# Patient Record
Sex: Female | Born: 1960 | Race: White | Hispanic: No | Smoking: Current every day smoker
Health system: Southern US, Community
[De-identification: ages and names within clinical notes are randomized; demographics above are authoritative.]

## PROBLEM LIST (undated history)

## (undated) DIAGNOSIS — K8689 Other specified diseases of pancreas: Secondary | ICD-10-CM

## (undated) DIAGNOSIS — R32 Unspecified urinary incontinence: Secondary | ICD-10-CM

## (undated) DIAGNOSIS — Z860100 Personal history of colon polyps, unspecified: Secondary | ICD-10-CM

## (undated) DIAGNOSIS — R51 Headache: Secondary | ICD-10-CM

## (undated) DIAGNOSIS — G629 Polyneuropathy, unspecified: Secondary | ICD-10-CM

## (undated) DIAGNOSIS — E871 Hypo-osmolality and hyponatremia: Secondary | ICD-10-CM

## (undated) DIAGNOSIS — K648 Other hemorrhoids: Secondary | ICD-10-CM

## (undated) DIAGNOSIS — D1803 Hemangioma of intra-abdominal structures: Secondary | ICD-10-CM

## (undated) DIAGNOSIS — G2581 Restless legs syndrome: Secondary | ICD-10-CM

## (undated) DIAGNOSIS — F410 Panic disorder [episodic paroxysmal anxiety] without agoraphobia: Secondary | ICD-10-CM

## (undated) DIAGNOSIS — Z8719 Personal history of other diseases of the digestive system: Secondary | ICD-10-CM

## (undated) DIAGNOSIS — M2540 Effusion, unspecified joint: Secondary | ICD-10-CM

## (undated) DIAGNOSIS — E559 Vitamin D deficiency, unspecified: Secondary | ICD-10-CM

## (undated) DIAGNOSIS — I5042 Chronic combined systolic (congestive) and diastolic (congestive) heart failure: Secondary | ICD-10-CM

## (undated) DIAGNOSIS — E8801 Alpha-1-antitrypsin deficiency: Secondary | ICD-10-CM

## (undated) DIAGNOSIS — E119 Type 2 diabetes mellitus without complications: Secondary | ICD-10-CM

## (undated) DIAGNOSIS — G510 Bell's palsy: Secondary | ICD-10-CM

## (undated) DIAGNOSIS — Z8614 Personal history of Methicillin resistant Staphylococcus aureus infection: Secondary | ICD-10-CM

## (undated) DIAGNOSIS — M549 Dorsalgia, unspecified: Secondary | ICD-10-CM

## (undated) DIAGNOSIS — G4733 Obstructive sleep apnea (adult) (pediatric): Secondary | ICD-10-CM

## (undated) DIAGNOSIS — Z8711 Personal history of peptic ulcer disease: Secondary | ICD-10-CM

## (undated) DIAGNOSIS — E1140 Type 2 diabetes mellitus with diabetic neuropathy, unspecified: Secondary | ICD-10-CM

## (undated) DIAGNOSIS — R160 Hepatomegaly, not elsewhere classified: Secondary | ICD-10-CM

## (undated) DIAGNOSIS — M5137 Other intervertebral disc degeneration, lumbosacral region: Secondary | ICD-10-CM

## (undated) DIAGNOSIS — M47816 Spondylosis without myelopathy or radiculopathy, lumbar region: Secondary | ICD-10-CM

## (undated) DIAGNOSIS — Z8619 Personal history of other infectious and parasitic diseases: Secondary | ICD-10-CM

## (undated) DIAGNOSIS — G8929 Other chronic pain: Secondary | ICD-10-CM

## (undated) DIAGNOSIS — I10 Essential (primary) hypertension: Secondary | ICD-10-CM

## (undated) DIAGNOSIS — J45909 Unspecified asthma, uncomplicated: Secondary | ICD-10-CM

## (undated) DIAGNOSIS — M810 Age-related osteoporosis without current pathological fracture: Secondary | ICD-10-CM

## (undated) DIAGNOSIS — D649 Anemia, unspecified: Secondary | ICD-10-CM

## (undated) DIAGNOSIS — F411 Generalized anxiety disorder: Secondary | ICD-10-CM

## (undated) DIAGNOSIS — J449 Chronic obstructive pulmonary disease, unspecified: Secondary | ICD-10-CM

## (undated) DIAGNOSIS — M51379 Other intervertebral disc degeneration, lumbosacral region without mention of lumbar back pain or lower extremity pain: Secondary | ICD-10-CM

## (undated) DIAGNOSIS — F419 Anxiety disorder, unspecified: Secondary | ICD-10-CM

## (undated) DIAGNOSIS — M1990 Unspecified osteoarthritis, unspecified site: Secondary | ICD-10-CM

## (undated) DIAGNOSIS — E785 Hyperlipidemia, unspecified: Secondary | ICD-10-CM

## (undated) DIAGNOSIS — K219 Gastro-esophageal reflux disease without esophagitis: Secondary | ICD-10-CM

## (undated) DIAGNOSIS — Z889 Allergy status to unspecified drugs, medicaments and biological substances status: Secondary | ICD-10-CM

## (undated) DIAGNOSIS — I340 Nonrheumatic mitral (valve) insufficiency: Principal | ICD-10-CM

## (undated) DIAGNOSIS — I428 Other cardiomyopathies: Secondary | ICD-10-CM

## (undated) DIAGNOSIS — I517 Cardiomegaly: Secondary | ICD-10-CM

## (undated) DIAGNOSIS — Z720 Tobacco use: Secondary | ICD-10-CM

## (undated) DIAGNOSIS — M797 Fibromyalgia: Secondary | ICD-10-CM

## (undated) HISTORY — DX: Cardiomegaly: I51.7

## (undated) HISTORY — DX: Restless legs syndrome: G25.81

## (undated) HISTORY — PX: BREAST BIOPSY: SHX20

## (undated) HISTORY — PX: APPENDECTOMY: SHX54

## (undated) HISTORY — DX: Other intervertebral disc degeneration, lumbosacral region without mention of lumbar back pain or lower extremity pain: M51.379

## (undated) HISTORY — PX: CARDIAC CATHETERIZATION: SHX172

## (undated) HISTORY — DX: Anxiety disorder, unspecified: F41.9

## (undated) HISTORY — DX: Hyperlipidemia, unspecified: E78.5

## (undated) HISTORY — DX: Alpha-1-antitrypsin deficiency: E88.01

## (undated) HISTORY — DX: Obstructive sleep apnea (adult) (pediatric): G47.33

## (undated) HISTORY — DX: Generalized anxiety disorder: F41.1

## (undated) HISTORY — DX: Unspecified urinary incontinence: R32

## (undated) HISTORY — PX: OTHER SURGICAL HISTORY: SHX169

## (undated) HISTORY — DX: Essential (primary) hypertension: I10

## (undated) HISTORY — DX: Type 2 diabetes mellitus without complications: E11.9

## (undated) HISTORY — DX: Hepatomegaly, not elsewhere classified: R16.0

## (undated) HISTORY — DX: Nonrheumatic mitral (valve) insufficiency: I34.0

## (undated) HISTORY — DX: Other hemorrhoids: K64.8

## (undated) HISTORY — PX: CHOLECYSTECTOMY: SHX55

## (undated) HISTORY — DX: Morbid (severe) obesity due to excess calories: E66.01

## (undated) HISTORY — DX: Chronic obstructive pulmonary disease, unspecified: J44.9

## (undated) HISTORY — DX: Type 2 diabetes mellitus with diabetic neuropathy, unspecified: E11.40

## (undated) HISTORY — DX: Panic disorder (episodic paroxysmal anxiety): F41.0

## (undated) HISTORY — DX: Unspecified osteoarthritis, unspecified site: M19.90

## (undated) HISTORY — DX: Unspecified asthma, uncomplicated: J45.909

## (undated) HISTORY — DX: Personal history of other diseases of the digestive system: Z87.19

## (undated) HISTORY — PX: NASAL SINUS SURGERY: SHX719

## (undated) HISTORY — DX: Other cardiomyopathies: I42.8

## (undated) HISTORY — DX: Other specified diseases of pancreas: K86.89

## (undated) HISTORY — DX: Spondylosis without myelopathy or radiculopathy, lumbar region: M47.816

## (undated) HISTORY — DX: Vitamin D deficiency, unspecified: E55.9

## (undated) HISTORY — DX: Hypo-osmolality and hyponatremia: E87.1

## (undated) HISTORY — DX: Gastro-esophageal reflux disease without esophagitis: K21.9

## (undated) HISTORY — DX: Age-related osteoporosis without current pathological fracture: M81.0

## (undated) HISTORY — PX: ESOPHAGOGASTRODUODENOSCOPY: SHX1529

## (undated) HISTORY — DX: Tobacco use: Z72.0

## (undated) HISTORY — DX: Chronic combined systolic (congestive) and diastolic (congestive) heart failure: I50.42

## (undated) HISTORY — DX: Hemangioma of intra-abdominal structures: D18.03

## (undated) HISTORY — PX: COLONOSCOPY: SHX174

## (undated) HISTORY — DX: Other chronic pain: G89.29

## (undated) HISTORY — DX: Other intervertebral disc degeneration, lumbosacral region: M51.37

## (undated) HISTORY — DX: Anemia, unspecified: D64.9

## (undated) HISTORY — DX: Disorders of magnesium metabolism, unspecified: E83.40

---

## 2003-02-14 DIAGNOSIS — Z8614 Personal history of Methicillin resistant Staphylococcus aureus infection: Secondary | ICD-10-CM

## 2003-02-14 HISTORY — DX: Personal history of Methicillin resistant Staphylococcus aureus infection: Z86.14

## 2013-03-20 ENCOUNTER — Ambulatory Visit (INDEPENDENT_AMBULATORY_CARE_PROVIDER_SITE_OTHER): Payer: Medicaid Other | Admitting: Internal Medicine

## 2013-03-20 ENCOUNTER — Encounter (INDEPENDENT_AMBULATORY_CARE_PROVIDER_SITE_OTHER): Payer: Self-pay

## 2013-03-20 ENCOUNTER — Encounter: Payer: Self-pay | Admitting: Internal Medicine

## 2013-03-20 VITALS — BP 142/74 | HR 87 | Ht 63.0 in | Wt 216.0 lb

## 2013-03-20 DIAGNOSIS — I428 Other cardiomyopathies: Secondary | ICD-10-CM

## 2013-03-20 DIAGNOSIS — I429 Cardiomyopathy, unspecified: Secondary | ICD-10-CM

## 2013-03-20 DIAGNOSIS — I10 Essential (primary) hypertension: Secondary | ICD-10-CM

## 2013-03-20 DIAGNOSIS — G4733 Obstructive sleep apnea (adult) (pediatric): Secondary | ICD-10-CM

## 2013-03-20 DIAGNOSIS — I340 Nonrheumatic mitral (valve) insufficiency: Secondary | ICD-10-CM

## 2013-03-20 DIAGNOSIS — I509 Heart failure, unspecified: Secondary | ICD-10-CM

## 2013-03-20 DIAGNOSIS — I059 Rheumatic mitral valve disease, unspecified: Secondary | ICD-10-CM

## 2013-03-20 DIAGNOSIS — I5042 Chronic combined systolic (congestive) and diastolic (congestive) heart failure: Secondary | ICD-10-CM

## 2013-03-20 NOTE — Progress Notes (Signed)
Primary Care Physician: Glendon Axe, MD Referring Physician:  Dr Lawson Radar  Audrey Olson is a 53 y.o. female with a h/o a nonischemic cardiomyopathy, NYHA Class III CHF, and mitral regurgitation who presents today for EP consultation.  She reports having a nonischemic cardiomyopathy diagnosed 4 months ago.  She reports that her primary concern is being frequently tired.  She falls asleep all of the time.  She has had a sleep study which she says reveals sleep apnea.   She was recommended to use CPAP but could not tolerate this.  She has fatigue.  She denies SOB.  She is limited by leg weakness which occurs after walking 100 feet.  She has 3 pillow orthopnea.  She continues to smoke and is not ready to quit.  Today, she denies symptoms of palpitations, chest pain, lower extremity edema, dizziness, presyncope, syncope, or neurologic sequela. The patient is tolerating medications without difficulties and is otherwise without complaint today.   Past Medical History  Diagnosis Date  . Lumbar spondylosis   . Hypertrophy of breast   . CKD (chronic kidney disease) stage 3, GFR 30-59 ml/min   . Hypertension   . Hyperlipidemia   . Acid reflux   . Obesity   . Restless legs syndrome (RLS)   . Tobacco use disorder   . Vitamin D deficiency   . Diabetes mellitus type 2, uncontrolled   . History of noncompliance with medical treatment, presenting hazards to health   . History of rectal bleeding   . Proteinuria   . Hepatomegaly     With splenomegaly  . Unspecified urinary incontinence   . Fatigue   . Unspecified sleep apnea     mild per pt  . Degeneration of lumbar or lumbosacral intervertebral disc   . Fatty pancreas   . Fatty liver   . Internal hemorrhoids   . LVH (left ventricular hypertrophy)   . CHF (congestive heart failure)   . Cardiomyopathy   . Benign essential tremor   . Generalized anxiety disorder   . Anemia   . Hyponatremia   . Disorders of magnesium metabolism   .  Alpha-1-antitrypsin deficiency     listed, she is not aware of this diagnosis  . Colon polyp   . Hemangioma of spleen    Past Surgical History  Procedure Laterality Date  . Breast biopsy    . Nasal sinus surgery    . Appendectomy    . Cholecystectomy    . Incisional hernia repair      Current Outpatient Prescriptions  Medication Sig Dispense Refill  . albuterol (PROVENTIL HFA;VENTOLIN HFA) 108 (90 BASE) MCG/ACT inhaler Inhale 2 puffs into the lungs every 6 (six) hours as needed for wheezing or shortness of breath.      . carvedilol (COREG) 25 MG tablet Take 25 mg by mouth 2 (two) times daily with a meal.      . clonazePAM (KLONOPIN) 1 MG tablet Take 1 mg by mouth at bedtime as needed for anxiety.      . cyclobenzaprine (FLEXERIL) 10 MG tablet Take 10 mg by mouth 3 (three) times daily as needed for muscle spasms.      Marland Kitchen HYDROcodone-acetaminophen (NORCO) 10-325 MG per tablet Take 1 tablet by mouth every 6 (six) hours as needed.      . insulin aspart (NOVOLOG FLEXPEN) 100 UNIT/ML FlexPen Sliding scale      . insulin glargine (LANTUS) 100 UNIT/ML injection Inject 80 Units into the skin 3 (three) times  daily.      . Magnesium Oxide 250 MG TABS Take 1 tablet by mouth daily.      . metFORMIN (GLUCOPHAGE) 1000 MG tablet Take 1,000 mg by mouth 2 (two) times daily with a meal.      . nitroGLYCERIN (NITROSTAT) 0.4 MG SL tablet Place 0.4 mg under the tongue every 5 (five) minutes as needed for chest pain.      Marland Kitchen omega-3 acid ethyl esters (LOVAZA) 1 G capsule Take 1 g by mouth daily.      Marland Kitchen omeprazole (PRILOSEC) 40 MG capsule Take 40 mg by mouth daily.      . pregabalin (LYRICA) 150 MG capsule Take 150 mg by mouth 2 (two) times daily.      . rosuvastatin (CRESTOR) 40 MG tablet Take 40 mg by mouth daily.      . valsartan (DIOVAN) 160 MG tablet Take 160 mg by mouth daily.      . Vitamin D, Ergocalciferol, (DRISDOL) 50000 UNITS CAPS capsule Take 50,000 Units by mouth every 7 (seven) days.       No  current facility-administered medications for this visit.    Allergies  Allergen Reactions  . Sulfa Antibiotics Anaphylaxis    History   Social History  . Marital Status: Divorced    Spouse Name: N/A    Number of Children: N/A  . Years of Education: N/A   Occupational History  . Not on file.   Social History Main Topics  . Smoking status: Current Every Day Smoker -- 0.33 packs/day    Types: Cigarettes    Start date: 02/13/1974  . Smokeless tobacco: Not on file  . Alcohol Use: No  . Drug Use: No  . Sexual Activity: Not on file   Other Topics Concern  . Not on file   Social History Narrative   Lives in [place] with sister.  Unemployed    Family History  Problem Relation Age of Onset  . Hyperlipidemia Mother   . Hypertension Mother   . Heart disease Mother   . Hyperlipidemia Father   . Diabetes Father     DM 2   . Hypertension Father   . Heart disease Father     ROS- All systems are reviewed and negative except as per the HPI above  Physical Exam: Filed Vitals:   03/20/13 0854  BP: 142/74  Pulse: 87  Height: 5\' 3"  (1.6 m)  Weight: 216 lb (97.977 kg)    GEN- The patient is overweight appearing, alert and oriented x 3 today.   Head- normocephalic, atraumatic Eyes-  Sclera clear, conjunctiva pink Ears- hearing intact Oropharynx- clear Neck- supple, no JVP Lymph- no cervical lymphadenopathy Lungs- Clear to ausculation bilaterally, normal work of breathing Heart- Regular rate and rhythm, 2/6 SEM at the apex GI- soft, NT, ND, + BS Extremities- no clubbing, cyanosis, or edema MS- no significant deformity or atrophy Skin- no rash or lesion Psych- euthymic mood, full affect Neuro- strength and sensation are intact  2dEcho 01/30/13- EF <35%, LVEDD 54, LA 50, views were suboptimal and valves were not commented upon TEE 02/27/13- EF 30-33% with moderate LV dilatation, moderate to severe MR, RV systolic pressure 0000000 mm Hg  myoview 08/28/12- EF 19%,  no ischemia  ekg today reveals sinus rhythm 87 bpm, PR 166, QRS 96, QTc 476, septal infarct, otherwise normal ekg  Assessment and Plan:   1. Nonischemic CM (EF 30%), NYHA Class III CHF, and moderate to severe MR The patient  is referred for EP consultation and consideration of an ICD.  She has a narrow QRS and does not meet criteria for CRT.  She is on beta blocker and an ARB.  Her medicines appear to be optimized.  Myoview in 2014 revealed no reversible areas of ischemia.   Though she probably meets criteria for an ICD, I think that we should first further evaluate her MR.  This is moderate to severe by TEE.  I have spoken with Dr Claudie Leach who agrees with referral for consultation with Dr Roxy Manns for possible mitral valve repair.  She does have symptoms and could potentially benefit from this.  I have also spoken with Dr Roxy Manns who is willing to see the patient.  If she is felt to not be a candidate for valve repair then we could proceed with an ICD.  If she undergoes valve surgery then we would defer decisions regarding an ICD until we see how her EF will respond to surgery.  2. HTN Stable No change required today  3. OSA Compliance with weight loss is encouraged  She will return to see me for follow-up in 2 months.

## 2013-03-20 NOTE — Patient Instructions (Addendum)
Your physician recommends that you schedule a follow-up appointment in: 04/21/13 at 4:15  You have been referred to Dr Owen-----For MR  Your physician has recommended that you have a defibrillator inserted. An implantable cardioverter defibrillator (ICD) is a small device that is placed in your chest or, in rare cases, your abdomen. This device uses electrical pulses or shocks to help control life-threatening, irregular heartbeats that could lead the heart to suddenly stop beating (sudden cardiac arrest). Leads are attached to the ICD that goes into your heart. This is done in the hospital and usually requires an overnight stay. Please see the instruction sheet given to you today for more information.

## 2013-03-21 ENCOUNTER — Ambulatory Visit: Payer: Medicaid Other | Admitting: Thoracic Surgery (Cardiothoracic Vascular Surgery)

## 2013-03-28 ENCOUNTER — Institutional Professional Consult (permissible substitution) (INDEPENDENT_AMBULATORY_CARE_PROVIDER_SITE_OTHER): Payer: Medicaid Other | Admitting: Thoracic Surgery (Cardiothoracic Vascular Surgery)

## 2013-03-28 ENCOUNTER — Encounter: Payer: Self-pay | Admitting: Thoracic Surgery (Cardiothoracic Vascular Surgery)

## 2013-03-28 VITALS — BP 154/84 | HR 99 | Resp 20 | Ht 62.0 in | Wt 217.0 lb

## 2013-03-28 DIAGNOSIS — E1140 Type 2 diabetes mellitus with diabetic neuropathy, unspecified: Secondary | ICD-10-CM

## 2013-03-28 DIAGNOSIS — M810 Age-related osteoporosis without current pathological fracture: Secondary | ICD-10-CM

## 2013-03-28 DIAGNOSIS — I10 Essential (primary) hypertension: Secondary | ICD-10-CM

## 2013-03-28 DIAGNOSIS — I340 Nonrheumatic mitral (valve) insufficiency: Secondary | ICD-10-CM

## 2013-03-28 DIAGNOSIS — F411 Generalized anxiety disorder: Secondary | ICD-10-CM

## 2013-03-28 DIAGNOSIS — N189 Chronic kidney disease, unspecified: Secondary | ICD-10-CM

## 2013-03-28 DIAGNOSIS — Z992 Dependence on renal dialysis: Secondary | ICD-10-CM | POA: Insufficient documentation

## 2013-03-28 DIAGNOSIS — E1129 Type 2 diabetes mellitus with other diabetic kidney complication: Secondary | ICD-10-CM

## 2013-03-28 DIAGNOSIS — F17200 Nicotine dependence, unspecified, uncomplicated: Secondary | ICD-10-CM

## 2013-03-28 DIAGNOSIS — G8929 Other chronic pain: Secondary | ICD-10-CM

## 2013-03-28 DIAGNOSIS — M1990 Unspecified osteoarthritis, unspecified site: Secondary | ICD-10-CM

## 2013-03-28 DIAGNOSIS — M129 Arthropathy, unspecified: Secondary | ICD-10-CM

## 2013-03-28 DIAGNOSIS — E119 Type 2 diabetes mellitus without complications: Secondary | ICD-10-CM

## 2013-03-28 DIAGNOSIS — I428 Other cardiomyopathies: Secondary | ICD-10-CM

## 2013-03-28 DIAGNOSIS — E1121 Type 2 diabetes mellitus with diabetic nephropathy: Secondary | ICD-10-CM

## 2013-03-28 DIAGNOSIS — E1142 Type 2 diabetes mellitus with diabetic polyneuropathy: Secondary | ICD-10-CM

## 2013-03-28 DIAGNOSIS — F419 Anxiety disorder, unspecified: Secondary | ICD-10-CM | POA: Insufficient documentation

## 2013-03-28 DIAGNOSIS — E669 Obesity, unspecified: Secondary | ICD-10-CM

## 2013-03-28 DIAGNOSIS — E559 Vitamin D deficiency, unspecified: Secondary | ICD-10-CM | POA: Insufficient documentation

## 2013-03-28 DIAGNOSIS — I5042 Chronic combined systolic (congestive) and diastolic (congestive) heart failure: Secondary | ICD-10-CM | POA: Insufficient documentation

## 2013-03-28 DIAGNOSIS — E7800 Pure hypercholesterolemia, unspecified: Secondary | ICD-10-CM

## 2013-03-28 DIAGNOSIS — I059 Rheumatic mitral valve disease, unspecified: Secondary | ICD-10-CM

## 2013-03-28 DIAGNOSIS — J449 Chronic obstructive pulmonary disease, unspecified: Secondary | ICD-10-CM

## 2013-03-28 DIAGNOSIS — G4733 Obstructive sleep apnea (adult) (pediatric): Secondary | ICD-10-CM | POA: Insufficient documentation

## 2013-03-28 DIAGNOSIS — I509 Heart failure, unspecified: Secondary | ICD-10-CM

## 2013-03-28 DIAGNOSIS — N186 End stage renal disease: Secondary | ICD-10-CM | POA: Insufficient documentation

## 2013-03-28 DIAGNOSIS — N058 Unspecified nephritic syndrome with other morphologic changes: Secondary | ICD-10-CM

## 2013-03-28 DIAGNOSIS — F410 Panic disorder [episodic paroxysmal anxiety] without agoraphobia: Secondary | ICD-10-CM

## 2013-03-28 DIAGNOSIS — Z720 Tobacco use: Secondary | ICD-10-CM

## 2013-03-28 DIAGNOSIS — E1149 Type 2 diabetes mellitus with other diabetic neurological complication: Secondary | ICD-10-CM

## 2013-03-28 NOTE — H&P (Signed)
[address]       [place] [address]             [facility] REPORT  Referring Provider is Franchot Mimes, MD PCP is Glendon Axe, MD  Chief Complaint  Patient presents with  . Mitral Regurgitation    Surgical eval, ECHO      HPI:  Patient is an obese 53 year old female with multiple medical problems referred for possible surgical treatment of mitral regurgitation.  The patient has history of hypertension, hyperlipidemia, and long-standing history of poorly controlled type 2 diabetes mellitus with complications including diabetic nephropathy and peripheral neuropathy. She has long-standing history of heavy tobacco abuse with COPD.  She does not recur when she is ever been told she had a heart murmur in the past, and she denies any known history of rheumatic heart disease. She was recently evaluated by Dr. Claudie Leach in [facility] and found to have global nonischemic cardiomyopathy with mitral regurgitation. Left heart catheterization performed in October 2014 at [facility] was notable for the presence of normal coronary artery anatomy with no significant coronary artery disease.  Right heart catheterization was not done.  She was referred to Dr. Rayann Heman to consider placement of a defibrillator, but because of the presence of significant underlying mitral regurgitation the patient has now been referred for possible surgical intervention.  The patient describes chronic symptoms of exertional shortness of breath which she claims to be relatively mild. She admits that she lives a very sedentary lifestyle as she is limited severely by chronic pain in her back, both hips and both legs.  She states that pain is by far her biggest problem and exertional shortness of breath does not limited her activity much on a daily basis. She denies any history of resting shortness of breath, PND, orthopnea, palpitations, or syncope. She has occasional  mild lower extremity edema and occasional dizzy spells. She has vague atypical symptoms of tightness across her chest that are not related to activity and seemed to be transient.  Past Medical History  Diagnosis Date  . Lumbar spondylosis   . Hypertrophy of breast   . CKD (chronic kidney disease) stage 3, GFR 30-59 ml/min   . Hypertension   . Hyperlipidemia   . Acid reflux   . Obesity   . Restless legs syndrome (RLS)   . Tobacco use disorder   . Vitamin D deficiency   . Diabetes mellitus type 2, uncontrolled   . History of noncompliance with medical treatment, presenting hazards to health   . History of rectal bleeding   . Proteinuria   . Hepatomegaly     With splenomegaly  . Unspecified urinary incontinence   . Fatigue   . Unspecified sleep apnea     mild per pt  . Degeneration of lumbar or lumbosacral intervertebral disc   . Fatty pancreas   . Fatty liver   . Internal hemorrhoids   . LVH (left ventricular hypertrophy)   . CHF (congestive heart failure)   . Cardiomyopathy   . Benign essential tremor   . Generalized anxiety disorder   . Anemia   . Hyponatremia   . Disorders of magnesium metabolism   . Alpha-1-antitrypsin deficiency     listed, she is not aware of this diagnosis  . Colon polyp   . Hemangioma of spleen   . Chronic combined systolic and diastolic CHF (congestive heart failure)   .  Non-ischemic cardiomyopathy   . Mitral regurgitation   . Type II diabetes mellitus     Insulin-dependent since 2008  . Diabetic neuropathy   . Chronic pain   . Panic attacks   . Anxiety   . Chronic kidney disease   . Diabetic nephropathy   . Tobacco abuse   . COPD (chronic obstructive pulmonary disease)   . Obstructive sleep apnea     Doesn't tolerate CPAP  . Severe obesity (BMI 35.0-39.9) with comorbidity   . Arthritis     Lower back and both hips with severe chronic pain and limited mobility  . Hypercholesterolemia   . Osteoporosis     Past Surgical History    Procedure Laterality Date  . Breast biopsy    . Nasal sinus surgery    . Appendectomy    . Cholecystectomy    . Incisional hernia repair      Family History  Problem Relation Age of Onset  . Hyperlipidemia Mother   . Hypertension Mother   . Heart disease Mother   . Hyperlipidemia Father   . Diabetes Father     DM 2   . Hypertension Father   . Heart disease Father     History   Social History  . Marital Status: Divorced    Spouse Name: N/A    Number of Children: N/A  . Years of Education: N/A   Occupational History  . Not on file.   Social History Main Topics  . Smoking status: Current Every Day Smoker -- 0.33 packs/day    Types: Cigarettes    Start date: 02/13/1974  . Smokeless tobacco: Not on file  . Alcohol Use: No  . Drug Use: No  . Sexual Activity: Not on file   Other Topics Concern  . Not on file   Social History Narrative   Lives in [place] with sister.  Unemployed    Current Outpatient Prescriptions  Medication Sig Dispense Refill  . albuterol (PROVENTIL HFA;VENTOLIN HFA) 108 (90 BASE) MCG/ACT inhaler Inhale 2 puffs into the lungs every 6 (six) hours as needed for wheezing or shortness of breath.      . carvedilol (COREG) 25 MG tablet Take 25 mg by mouth 2 (two) times daily with a meal.      . clonazePAM (KLONOPIN) 1 MG tablet Take 1 mg by mouth at bedtime as needed for anxiety.      . cyclobenzaprine (FLEXERIL) 10 MG tablet Take 10 mg by mouth 3 (three) times daily as needed for muscle spasms.      Marland Kitchen HYDROcodone-acetaminophen (NORCO) 10-325 MG per tablet Take 1 tablet by mouth every 6 (six) hours as needed.      . insulin aspart (NOVOLOG FLEXPEN) 100 UNIT/ML FlexPen Sliding scale      . insulin glargine (LANTUS) 100 UNIT/ML injection Inject 80 Units into the skin 3 (three) times daily.      . Magnesium Oxide 250 MG TABS Take 1 tablet by mouth daily.      . metFORMIN (GLUCOPHAGE) 1000 MG tablet Take 1,000 mg by mouth 2 (two) times daily with a  meal.      . nitroGLYCERIN (NITROSTAT) 0.4 MG SL tablet Place 0.4 mg under the tongue every 5 (five) minutes as needed for chest pain.      Marland Kitchen omega-3 acid ethyl esters (LOVAZA) 1 G capsule Take 1 g by mouth daily.      Marland Kitchen omeprazole (PRILOSEC) 40 MG capsule Take 40 mg by mouth  daily.      . pregabalin (LYRICA) 150 MG capsule Take 150 mg by mouth 2 (two) times daily.      . rosuvastatin (CRESTOR) 40 MG tablet Take 40 mg by mouth daily.      . valsartan (DIOVAN) 160 MG tablet Take 160 mg by mouth daily.      . Vitamin D, Ergocalciferol, (DRISDOL) 50000 UNITS CAPS capsule Take 50,000 Units by mouth every 7 (seven) days.       No current facility-administered medications for this visit.    Allergies  Allergen Reactions  . Sulfa Antibiotics Anaphylaxis      Review of Systems:   General:  normal appetite, decreased energy, no weight gain, no weight loss, no fever  Cardiac:  no chest pain with exertion, + transient atypical chest pain at rest, + SOB with exertion, no resting SOB, no PND, no orthopnea, no palpitations, no arrhythmia, no atrial fibrillation, + intermittent mild LE edema, occasional dizzy spells, no syncope  Respiratory:  + shortness of breath, no home oxygen, no productive cough, + dry cough, no bronchitis, no wheezing, no hemoptysis, no asthma, no pain with inspiration or cough, + sleep apnea, doesn't tolerate CPAP at night  GI:   no difficulty swallowing, + reflux, no frequent heartburn, no hiatal hernia, + abdominal pain, no constipation, no diarrhea, no hematochezia, no hematemesis, no melena  GU:   no dysuria,  + frequency, no urinary tract infection, no hematuria, no kidney stones, + chronic kidney disease with proteinuria  Vascular:  + pain suggestive of claudication, + pain in feet, no leg cramps, no varicose veins, no DVT, no non-healing foot ulcer  Neuro:   no stroke, ? TIA's, no seizures, no headaches, no temporary blindness one eye,  no slurred speech, + peripheral  neuropathy, + severe chronic pain, no instability of gait, no memory/cognitive dysfunction  Musculoskeletal: + arthritis, no joint swelling, + myalgias, + difficulty walking, limited mobility   Skin:   no rash, no itching, no skin infections, no pressure sores or ulcerations  Psych:   + anxiety, no depression, + nervousness, no unusual recent stress  Eyes:   no blurry vision, no floaters, no recent vision changes, + wears glasses or contacts  ENT:   no hearing loss, no loose or painful teeth, no dentures, last saw dentist > 10 years ago  Hematologic:  no easy bruising, no abnormal bleeding, no clotting disorder, no frequent epistaxis  Endocrine:  + diabetes, routinely checks CBG's at home     Physical Exam:   BP 154/84  Pulse 99  Resp 20  Ht 5\' 2"  (1.575 m)  Wt 217 lb (98.431 kg)  BMI 39.68 kg/m2  SpO2 95%  General:  Obese, chronically ill-appearing  HEENT:  Unremarkable   Neck:   no JVD, no bruits, no adenopathy   Chest:   clear to auscultation, symmetrical breath sounds, no wheezes, no rhonchi   CV:   RRR, no murmur   Abdomen:  soft, non-tender, no masses   Extremities:  warm, well-perfused, pulses not palpable, no LE edema  Rectal/GU  Deferred  Neuro:   Grossly non-focal and symmetrical throughout  Skin:   Clean and dry, no rashes, no breakdown   Diagnostic Tests:  TRANSESOPHAGEAL ECHOCARDIOGRAM  Images from transesophageal echocardiogram performed 02/27/2013 at [facility] are reviewed. The patient has severe global left ventricular systolic dysfunction.  There is type I dysfunction of the mitral valve with at least moderate if not severe mitral regurgitation.  The aortic valve appears normal. Right ventricular size and systolic function appears fairly normal. There is mild tricuspid regurgitation. No other significant abnormalities are noted. Official report from this echocardiogram is not available at the present time.    CARDIAC CATHETERIZATION  Report from  left heart catheterization performed 12/09/2012 at [facility] is reviewed. Images from this catheterization are not available for review at this time. By report the patient had normal coronary artery anatomy with no significant coronary artery disease. Left ventricular ejection fraction was estimated 35%. There was no comment made about the presence or absence of mitral regurgitation. Right heart catheterization was not performed.   Impression:  The patient has nonischemic cardiomyopathy with at least moderate if not severe left ventricular dysfunction and perhaps moderate to severe mitral regurgitation. Pathophysiology of the patient's mitral regurgitation appears to be primarily functional and related to type I dysfunction. She describes mild symptoms of exertional shortness of breath. It is not clear at present whether or not she has had significant congestive heart failure in the past, and unfortunately we do not have records from her cardiologist's office.   In addition, we do not have electrocardiogram to review to assess for QRS duration or other findings to suggest the patient might benefit from resynchronization therapy.  Risks associated with mitral valve repair would be somewhat elevated because of the patient's numerous comorbid medical problems including poorly controlled diabetes with numerous associated manifestations, ongoing and long-standing tobacco abuse with underlying COPD, obesity, and severe chronic pain with anxiety. Furthermore, there is some mention in the patient's records of problems with medical noncompliance in the past.   Plan:  We will attempt to obtain records from Dr. Verdon Cummins office as well as a copy of the heart catheterization performed last October. The patient needs dental service consultation and pulmonary function testing. The need to quit smoking has been emphasized. We will obtain CT angiogram of the aorta to assess possible cannulation strategies for surgery.  We  will consider whether or not to obtain right heart catheterization depending on plans in the future. The patient will return in 3 weeks for further followup.    Valentina Gu. Roxy Manns, MD 03/28/2013 3:11 PM

## 2013-03-28 NOTE — Patient Instructions (Signed)
The patient should make every effort to stop smoking immediately and permanently.   Endocarditis is a potentially serious infection of heart valves or inside lining of the heart.  It occurs more commonly in patients with diseased heart valves (such as patient's with aortic or mitral valve disease) and in patients who have undergone heart valve repair or replacement.  Certain surgical and dental procedures may put you at risk, such as dental cleaning, other dental procedures, or any surgery involving the respiratory, urinary, gastrointestinal tract, gallbladder or prostate gland.   To minimize your chances for develooping endocarditis, maintain good oral health and seek prompt medical attention for any infections involving the mouth, teeth, gums, skin or urinary tract.  Always notify your doctor or dentist about your underlying heart valve condition before having any invasive procedures. You will need to take antibiotics before certain procedures.

## 2013-04-02 ENCOUNTER — Other Ambulatory Visit: Payer: Self-pay | Admitting: *Deleted

## 2013-04-02 DIAGNOSIS — I340 Nonrheumatic mitral (valve) insufficiency: Secondary | ICD-10-CM

## 2013-04-02 DIAGNOSIS — I7100 Dissection of unspecified site of aorta: Secondary | ICD-10-CM

## 2013-04-04 ENCOUNTER — Other Ambulatory Visit (HOSPITAL_COMMUNITY): Payer: Medicaid Other | Admitting: Dentistry

## 2013-04-04 ENCOUNTER — Telehealth (HOSPITAL_COMMUNITY): Payer: Self-pay | Admitting: Dentistry

## 2013-04-04 NOTE — Telephone Encounter (Signed)
04/04/2013  Patient:            Audrey Olson Date of Birth:  [birth year] MRN:                [id number]  Patient was a No show- No call for Dental Consultation this morning. No answer at home. Sister was called and indicated that patient was evaluated last evening and this morning for urinary tract infection at [facility] emergency department. Sister is to have the patient call back to reschedule appointment once medically stable. LIngram/Dr. Enrique Sack

## 2013-04-09 ENCOUNTER — Encounter: Payer: Self-pay | Admitting: *Deleted

## 2013-04-11 ENCOUNTER — Ambulatory Visit (HOSPITAL_COMMUNITY): Payer: Medicaid Other

## 2013-04-11 ENCOUNTER — Other Ambulatory Visit (HOSPITAL_COMMUNITY): Payer: Medicaid Other

## 2013-04-11 ENCOUNTER — Inpatient Hospital Stay (HOSPITAL_COMMUNITY): Admission: RE | Admit: 2013-04-11 | Payer: Medicaid Other | Source: Ambulatory Visit

## 2013-04-11 ENCOUNTER — Ambulatory Visit (HOSPITAL_COMMUNITY): Admission: RE | Admit: 2013-04-11 | Payer: Medicaid Other | Source: Ambulatory Visit

## 2013-04-15 ENCOUNTER — Ambulatory Visit (HOSPITAL_COMMUNITY)
Admission: RE | Admit: 2013-04-15 | Discharge: 2013-04-15 | Disposition: A | Discharge status: Home / Self Care | Payer: Medicaid Other | Source: Ambulatory Visit | Attending: Thoracic Surgery (Cardiothoracic Vascular Surgery) | Admitting: Thoracic Surgery (Cardiothoracic Vascular Surgery)

## 2013-04-15 ENCOUNTER — Ambulatory Visit (HOSPITAL_COMMUNITY): Admission: RE | Admit: 2013-04-15 | Payer: Medicaid Other | Source: Ambulatory Visit

## 2013-04-15 ENCOUNTER — Other Ambulatory Visit: Payer: Self-pay

## 2013-04-15 DIAGNOSIS — I059 Rheumatic mitral valve disease, unspecified: Secondary | ICD-10-CM | POA: Insufficient documentation

## 2013-04-15 DIAGNOSIS — I7100 Dissection of unspecified site of aorta: Secondary | ICD-10-CM | POA: Diagnosis not present

## 2013-04-15 DIAGNOSIS — I340 Nonrheumatic mitral (valve) insufficiency: Secondary | ICD-10-CM

## 2013-04-15 DIAGNOSIS — I517 Cardiomegaly: Secondary | ICD-10-CM

## 2013-04-15 LAB — POCT I-STAT, CHEM 8
BUN: 33 mg/dL — ABNORMAL HIGH (ref 6–23)
CALCIUM ION: 1.2 mmol/L (ref 1.12–1.23)
CREATININE: 1.3 mg/dL — AB (ref 0.50–1.10)
Chloride: 104 mEq/L (ref 96–112)
GLUCOSE: 480 mg/dL — AB (ref 70–99)
HEMATOCRIT: 40 % (ref 36.0–46.0)
HEMOGLOBIN: 13.6 g/dL (ref 12.0–15.0)
POTASSIUM: 4.9 meq/L (ref 3.7–5.3)
Sodium: 133 mEq/L — ABNORMAL LOW (ref 137–147)
TCO2: 26 mmol/L (ref 0–100)

## 2013-04-15 LAB — PULMONARY FUNCTION TEST
DL/VA % pred: 95 %
DL/VA: 4.49 ml/min/mmHg/L
DLCO UNC % PRED: 73 %
DLCO UNC: 16.81 ml/min/mmHg
FEF 25-75 PRE: 0.88 L/s
FEF 25-75 Post: 1.39 L/sec
FEF2575-%Change-Post: 57 %
FEF2575-%Pred-Post: 52 %
FEF2575-%Pred-Pre: 33 %
FEV1-%CHANGE-POST: 11 %
FEV1-%PRED-POST: 65 %
FEV1-%Pred-Pre: 59 %
FEV1-PRE: 1.59 L
FEV1-Post: 1.77 L
FEV1FVC-%Change-Post: 5 %
FEV1FVC-%Pred-Pre: 83 %
FEV6-%Change-Post: 6 %
FEV6-%PRED-POST: 75 %
FEV6-%PRED-PRE: 70 %
FEV6-POST: 2.48 L
FEV6-Pre: 2.32 L
FEV6FVC-%CHANGE-POST: 1 %
FEV6FVC-%PRED-POST: 102 %
FEV6FVC-%PRED-PRE: 101 %
FVC-%Change-Post: 5 %
FVC-%PRED-PRE: 69 %
FVC-%Pred-Post: 73 %
FVC-POST: 2.49 L
FVC-PRE: 2.37 L
POST FEV6/FVC RATIO: 99 %
PRE FEV6/FVC RATIO: 98 %
Post FEV1/FVC ratio: 71 %
Pre FEV1/FVC ratio: 67 %
RV % PRED: 104 %
RV: 1.84 L
TLC % PRED: 83 %
TLC: 4.08 L

## 2013-04-15 MED ORDER — ALBUTEROL SULFATE (2.5 MG/3ML) 0.083% IN NEBU
2.5000 mg | INHALATION_SOLUTION | Freq: Once | RESPIRATORY_TRACT | Status: AC
  Administered 2013-04-15: 2.5 mg via RESPIRATORY_TRACT

## 2013-04-15 MED ORDER — IOHEXOL 350 MG/ML SOLN
100.0000 mL | Freq: Once | INTRAVENOUS | Status: AC | PRN
  Administered 2013-04-15: 100 mL via INTRAVENOUS

## 2013-04-15 NOTE — Progress Notes (Signed)
Echo Lab  2D Echocardiogram completed.  Gwynn, RDCS 04/15/2013 8:51 AM

## 2013-04-17 ENCOUNTER — Other Ambulatory Visit (HOSPITAL_COMMUNITY): Payer: Medicaid Other | Admitting: Dentistry

## 2013-04-17 ENCOUNTER — Telehealth (HOSPITAL_COMMUNITY): Payer: Self-pay

## 2013-04-17 NOTE — Telephone Encounter (Signed)
04/17/2013  Broken appt. /Patient called on day of appt. to cancel dental consult for 04/17/13 @ 9:00 w/ Dr. Enrique Sack. Patient stated she will call back at a later date to reschedule. LRI

## 2013-04-21 ENCOUNTER — Ambulatory Visit (INDEPENDENT_AMBULATORY_CARE_PROVIDER_SITE_OTHER): Payer: Medicaid Other | Admitting: Internal Medicine

## 2013-04-21 ENCOUNTER — Encounter: Payer: Self-pay | Admitting: Internal Medicine

## 2013-04-21 VITALS — BP 138/64 | HR 100 | Ht 65.0 in | Wt 222.0 lb

## 2013-04-21 DIAGNOSIS — I10 Essential (primary) hypertension: Secondary | ICD-10-CM

## 2013-04-21 DIAGNOSIS — G4733 Obstructive sleep apnea (adult) (pediatric): Secondary | ICD-10-CM

## 2013-04-21 DIAGNOSIS — I509 Heart failure, unspecified: Secondary | ICD-10-CM

## 2013-04-21 DIAGNOSIS — I428 Other cardiomyopathies: Secondary | ICD-10-CM

## 2013-04-21 DIAGNOSIS — I059 Rheumatic mitral valve disease, unspecified: Secondary | ICD-10-CM

## 2013-04-21 DIAGNOSIS — I5042 Chronic combined systolic (congestive) and diastolic (congestive) heart failure: Secondary | ICD-10-CM

## 2013-04-21 DIAGNOSIS — I340 Nonrheumatic mitral (valve) insufficiency: Secondary | ICD-10-CM

## 2013-04-21 NOTE — Patient Instructions (Signed)
Your physician has recommended that you have a defibrillator inserted. An implantable cardioverter defibrillator (ICD) is a small device that is placed in your chest or, in rare cases, your abdomen. This device uses electrical pulses or shocks to help control life-threatening, irregular heartbeats that could lead the heart to suddenly stop beating (sudden cardiac arrest). Leads are attached to the ICD that goes into your heart. This is done in the hospital and usually requires an overnight stay. Please see the instruction sheet given to you today for more information.    Call me Leonia Reader after you have your teeth taken care of

## 2013-04-23 NOTE — Progress Notes (Signed)
Primary Care Physician: Glendon Axe, MD Referring Physician:  Dr Lawson Radar  Audrey Olson is a 53 y.o. female with a h/o a nonischemic cardiomyopathy, NYHA Class III CHF, and mitral regurgitation who presents today for EP follow-up.  When I saw her last, I referred her to Dr Roxy Manns for evaluation of mitral regurgitation.  Interestingly 2D echo 04/15/13 revealed only trivial MR with EF 25%.  She has been referred to a dental surgeon due to poor dentition but has not been compliant with this yet.  She remains limited by leg weakness which occurs after walking 100 feet.  She has 3 pillow orthopnea.  She continues to smoke and is not ready to quit. Today, she denies symptoms of palpitations, chest pain, lower extremity edema, dizziness, presyncope, syncope, or neurologic sequela. The patient is tolerating medications without difficulties and is otherwise without complaint today.   Past Medical History  Diagnosis Date  . Lumbar spondylosis   . Hypertrophy of breast   . CKD (chronic kidney disease) stage 3, GFR 30-59 ml/min   . Hypertension   . Hyperlipidemia   . Acid reflux   . Obesity   . Restless legs syndrome (RLS)   . Tobacco use disorder   . Vitamin D deficiency   . Diabetes mellitus type 2, uncontrolled   . History of noncompliance with medical treatment, presenting hazards to health   . History of rectal bleeding   . Proteinuria   . Hepatomegaly     With splenomegaly  . Unspecified urinary incontinence   . Fatigue   . Unspecified sleep apnea     mild per pt  . Degeneration of lumbar or lumbosacral intervertebral disc   . Fatty pancreas   . Fatty liver   . Internal hemorrhoids   . LVH (left ventricular hypertrophy)   . CHF (congestive heart failure)   . Cardiomyopathy   . Benign essential tremor   . Generalized anxiety disorder   . Anemia   . Hyponatremia   . Disorders of magnesium metabolism   . Alpha-1-antitrypsin deficiency     listed, she is not aware of this  diagnosis  . Colon polyp   . Hemangioma of spleen   . Chronic combined systolic and diastolic CHF (congestive heart failure)   . Non-ischemic cardiomyopathy   . Mitral regurgitation   . Type II diabetes mellitus     Insulin-dependent since 2008  . Diabetic neuropathy   . Chronic pain   . Panic attacks   . Anxiety   . Chronic kidney disease   . Diabetic nephropathy   . Tobacco abuse   . COPD (chronic obstructive pulmonary disease)   . Obstructive sleep apnea     Doesn't tolerate CPAP  . Severe obesity (BMI 35.0-39.9) with comorbidity   . Arthritis     Lower back and both hips with severe chronic pain and limited mobility  . Hypercholesterolemia   . Osteoporosis    Past Surgical History  Procedure Laterality Date  . Breast biopsy    . Nasal sinus surgery    . Appendectomy    . Cholecystectomy    . Incisional hernia repair      Current Outpatient Prescriptions  Medication Sig Dispense Refill  . albuterol (PROVENTIL HFA;VENTOLIN HFA) 108 (90 BASE) MCG/ACT inhaler Inhale 2 puffs into the lungs every 6 (six) hours as needed for wheezing or shortness of breath.      . carvedilol (COREG) 25 MG tablet Take 25 mg by mouth 2 (two)  times daily with a meal.      . clonazePAM (KLONOPIN) 1 MG tablet Take 1 mg by mouth at bedtime as needed for anxiety.      . cyclobenzaprine (FLEXERIL) 10 MG tablet Take 10 mg by mouth 3 (three) times daily as needed for muscle spasms.      . fluticasone (FLONASE) 50 MCG/ACT nasal spray Place 2 sprays into both nostrils at bedtime.      Marland Kitchen HYDROcodone-acetaminophen (NORCO) 10-325 MG per tablet Take 1 tablet by mouth every 6 (six) hours as needed.      . insulin aspart (NOVOLOG FLEXPEN) 100 UNIT/ML FlexPen Sliding scale      . insulin glargine (LANTUS) 100 UNIT/ML injection Inject 80 Units into the skin 3 (three) times daily.      . Magnesium Oxide 250 MG TABS Take 1 tablet by mouth daily.      . metFORMIN (GLUCOPHAGE) 1000 MG tablet Take 1,000 mg by mouth 2  (two) times daily with a meal.      . nitroGLYCERIN (NITROSTAT) 0.4 MG SL tablet Place 0.4 mg under the tongue every 5 (five) minutes as needed for chest pain.      Marland Kitchen omega-3 acid ethyl esters (LOVAZA) 1 G capsule Take 1 g by mouth daily.      Marland Kitchen omeprazole (PRILOSEC) 40 MG capsule Take 40 mg by mouth daily.      . pregabalin (LYRICA) 150 MG capsule Take 150 mg by mouth every evening.       . rosuvastatin (CRESTOR) 40 MG tablet Take 40 mg by mouth daily.      . valsartan (DIOVAN) 160 MG tablet Take 160 mg by mouth daily.      . Vitamin D, Ergocalciferol, (DRISDOL) 50000 UNITS CAPS capsule Take 50,000 Units by mouth every 7 (seven) days.       No current facility-administered medications for this visit.    Allergies  Allergen Reactions  . Sulfa Antibiotics Anaphylaxis    Anaphylaxis with Sulfa only when combined with 800 mg Ibuprofen    History   Social History  . Marital Status: Divorced    Spouse Name: N/A    Number of Children: N/A  . Years of Education: N/A   Occupational History  . Not on file.   Social History Main Topics  . Smoking status: Current Every Day Smoker -- 0.33 packs/day    Types: Cigarettes    Start date: 02/13/1974  . Smokeless tobacco: Not on file  . Alcohol Use: No  . Drug Use: No  . Sexual Activity: Not on file   Other Topics Concern  . Not on file   Social History Narrative   Lives in [place] with sister.  Unemployed    Family History  Problem Relation Age of Onset  . Hyperlipidemia Mother   . Hypertension Mother   . Heart disease Mother   . Hyperlipidemia Father   . Diabetes Father     DM 2   . Hypertension Father   . Heart disease Father     ROS- All systems are reviewed and negative except as per the HPI above  Physical Exam: Filed Vitals:   04/21/13 1627  BP: 138/64  Pulse: 100  Height: 5\' 5"  (1.651 m)  Weight: 222 lb (100.699 kg)    GEN- The patient is overweight appearing, alert and oriented x 3 today.   Head-  normocephalic, atraumatic Eyes-  Sclera clear, conjunctiva pink Ears- hearing intact Oropharynx- clear Neck- supple,  no JVP Lymph- no cervical lymphadenopathy Lungs- Clear to ausculation bilaterally, normal work of breathing Heart- Regular rate and rhythm  GI- soft, NT, ND, + BS Extremities- no clubbing, cyanosis, or edema MS- no significant deformity or atrophy Skin- no rash or lesion Psych- euthymic mood, full affect Neuro- strength and sensation are intact  Echo 3/15 reviewed (as above)  myoview 08/28/12- EF 19%, no ischemia Dr Guy Sandifer notes are also reviewed ekg 03/20/13 reveals sinus rhythm 87 bpm, PR 166, QRS 96, QTc 476, septal infarct, otherwise normal ekg  Assessment and Plan:   1. MR Recent echo suggests that her MR is not likely to be surgical.  She has scheduled follow-up with Dr Roxy Manns.    2. Nonischemic CM (EF 30%), NYHA Class III CHF  If she does not require valvular repair (seems unlikely given recent echo), then we will proceed with ICD implantation after she has had the appropriate dental extraction performed.  I have encouraged her to follow-up with dental surgery as has been recommended by Dr Roxy Manns.  2. HTN Stable No change required today  3. OSA Compliance with weight loss is encouraged  She is instructed to contact our office after she has had dental care.  We will likely proceed with ICD implantation at that time.

## 2013-04-28 ENCOUNTER — Ambulatory Visit: Payer: Medicaid Other | Admitting: Thoracic Surgery (Cardiothoracic Vascular Surgery)

## 2013-05-01 ENCOUNTER — Encounter (HOSPITAL_COMMUNITY): Payer: Self-pay | Admitting: Dentistry

## 2013-05-01 ENCOUNTER — Encounter (INDEPENDENT_AMBULATORY_CARE_PROVIDER_SITE_OTHER): Payer: Self-pay

## 2013-05-01 ENCOUNTER — Ambulatory Visit (HOSPITAL_COMMUNITY): Payer: Medicaid - Dental | Admitting: Dentistry

## 2013-05-01 VITALS — BP 146/84 | HR 99 | Temp 98.3°F

## 2013-05-01 DIAGNOSIS — K029 Dental caries, unspecified: Secondary | ICD-10-CM

## 2013-05-01 DIAGNOSIS — K036 Deposits [accretions] on teeth: Secondary | ICD-10-CM

## 2013-05-01 DIAGNOSIS — R682 Dry mouth, unspecified: Secondary | ICD-10-CM

## 2013-05-01 DIAGNOSIS — I059 Rheumatic mitral valve disease, unspecified: Secondary | ICD-10-CM

## 2013-05-01 DIAGNOSIS — M264 Malocclusion, unspecified: Secondary | ICD-10-CM

## 2013-05-01 DIAGNOSIS — K0530 Chronic periodontitis, unspecified: Secondary | ICD-10-CM

## 2013-05-01 DIAGNOSIS — K045 Chronic apical periodontitis: Secondary | ICD-10-CM

## 2013-05-01 DIAGNOSIS — K083 Retained dental root: Secondary | ICD-10-CM

## 2013-05-01 DIAGNOSIS — K08109 Complete loss of teeth, unspecified cause, unspecified class: Secondary | ICD-10-CM

## 2013-05-01 DIAGNOSIS — Z0189 Encounter for other specified special examinations: Secondary | ICD-10-CM

## 2013-05-01 DIAGNOSIS — IMO0002 Reserved for concepts with insufficient information to code with codable children: Secondary | ICD-10-CM

## 2013-05-01 DIAGNOSIS — K117 Disturbances of salivary secretion: Secondary | ICD-10-CM

## 2013-05-01 NOTE — Progress Notes (Signed)
DENTAL CONSULTATION  Date of Consultation:  05/01/2013 Patient Name:   Audrey Olson Date of Birth:   1960/04/14 Medical Record Number: [id number]  VITALS: BP 146/84  Pulse 99  Temp(Src) 98.3 F (36.8 C) (Oral)   CHIEF COMPLAINT: Patient was referred by Dr. Roxy Manns for a pre-heart valve surgery dental protocol examination.  HPI: Audrey Olson is a 53 year old female recently diagnosed with heart failure and mitral regurgitation. Patient with anticipated heart valve surgery with Dr. Roxy Manns. Patient is now seen as part of a pre-heart valve surgery dental protocol examination to rule out dental infection that may affect the patient's systemic health, anticipated heart valve surgery, and put the patient at risk for bacterial endocarditis.  The patient currently denies acute toothache, swellings, or abscesses. Patient knows that she has several " broken teeth". Patient points to the upper left, lower left, and lower right as quadrants with broken teeth.  She has not seen a dentist since 2000. Patient had a tooth pulled at that time with no complications. Patient cannot remember the name of the dentist that she saw at that time. The patient does not have a regular primary dentist at this time. The patient has no partial dentures.  PROBLEM LIST: Patient Active Problem List   Diagnosis Date Noted  . Chronic combined systolic and diastolic CHF (congestive heart failure)   . Non-ischemic cardiomyopathy   . Mitral regurgitation   . Type II diabetes mellitus   . Diabetic neuropathy   . Chronic pain   . Panic attacks   . Anxiety   . Chronic kidney disease   . Diabetic nephropathy   . Tobacco abuse   . COPD (chronic obstructive pulmonary disease)   . Obstructive sleep apnea   . Severe obesity (BMI 35.0-39.9) with comorbidity   . Arthritis   . Hypertension   . Hypercholesterolemia   . Osteoporosis   . Vitamin D deficiency     PMH: Past Medical History  Diagnosis Date  . Lumbar spondylosis    . Hypertrophy of breast   . CKD (chronic kidney disease) stage 3, GFR 30-59 ml/min   . Hypertension   . Hyperlipidemia   . Acid reflux   . Obesity   . Restless legs syndrome (RLS)   . Tobacco use disorder   . Vitamin D deficiency   . Diabetes mellitus type 2, uncontrolled   . History of noncompliance with medical treatment, presenting hazards to health   . History of rectal bleeding   . Proteinuria   . Hepatomegaly     With splenomegaly  . Unspecified urinary incontinence   . Fatigue   . Unspecified sleep apnea     mild per pt  . Degeneration of lumbar or lumbosacral intervertebral disc   . Fatty pancreas   . Fatty liver   . Internal hemorrhoids   . LVH (left ventricular hypertrophy)   . CHF (congestive heart failure)   . Cardiomyopathy   . Benign essential tremor   . Generalized anxiety disorder   . Anemia   . Hyponatremia   . Disorders of magnesium metabolism   . Alpha-1-antitrypsin deficiency     listed, she is not aware of this diagnosis  . Colon polyp   . Hemangioma of spleen   . Chronic combined systolic and diastolic CHF (congestive heart failure)   . Non-ischemic cardiomyopathy   . Mitral regurgitation   . Type II diabetes mellitus     Insulin-dependent since 2008  . Diabetic neuropathy   .  Chronic pain   . Panic attacks   . Anxiety   . Chronic kidney disease   . Diabetic nephropathy   . Tobacco abuse   . COPD (chronic obstructive pulmonary disease)   . Obstructive sleep apnea     Doesn't tolerate CPAP  . Severe obesity (BMI 35.0-39.9) with comorbidity   . Arthritis     Lower back and both hips with severe chronic pain and limited mobility  . Hypercholesterolemia   . Osteoporosis     PSH: Past Surgical History  Procedure Laterality Date  . Breast biopsy    . Nasal sinus surgery    . Appendectomy    . Cholecystectomy    . Incisional hernia repair      ALLERGIES: Allergies  Allergen Reactions  . Sulfa Antibiotics Anaphylaxis     Anaphylaxis with Sulfa only when combined with 800 mg Ibuprofen    MEDICATIONS: Current Outpatient Prescriptions  Medication Sig Dispense Refill  . albuterol (PROVENTIL HFA;VENTOLIN HFA) 108 (90 BASE) MCG/ACT inhaler Inhale 2 puffs into the lungs every 6 (six) hours as needed for wheezing or shortness of breath.      . carvedilol (COREG) 25 MG tablet Take 25 mg by mouth 2 (two) times daily with a meal.      . clonazePAM (KLONOPIN) 1 MG tablet Take 1 mg by mouth at bedtime as needed for anxiety.      . cyclobenzaprine (FLEXERIL) 10 MG tablet Take 10 mg by mouth 3 (three) times daily as needed for muscle spasms.      . fluticasone (FLONASE) 50 MCG/ACT nasal spray Place 2 sprays into both nostrils at bedtime.      Marland Kitchen HYDROcodone-acetaminophen (NORCO) 10-325 MG per tablet Take 1 tablet by mouth every 6 (six) hours as needed.      . insulin aspart (NOVOLOG FLEXPEN) 100 UNIT/ML FlexPen Sliding scale      . insulin glargine (LANTUS) 100 UNIT/ML injection Inject 80 Units into the skin 3 (three) times daily.      . Magnesium Oxide 250 MG TABS Take 1 tablet by mouth daily.      . metFORMIN (GLUCOPHAGE) 1000 MG tablet Take 1,000 mg by mouth 2 (two) times daily with a meal.      . nitroGLYCERIN (NITROSTAT) 0.4 MG SL tablet Place 0.4 mg under the tongue every 5 (five) minutes as needed for chest pain.      Marland Kitchen omega-3 acid ethyl esters (LOVAZA) 1 G capsule Take 1 g by mouth daily.      Marland Kitchen omeprazole (PRILOSEC) 40 MG capsule Take 40 mg by mouth daily.      . pregabalin (LYRICA) 150 MG capsule Take 150 mg by mouth every evening.       . rosuvastatin (CRESTOR) 40 MG tablet Take 40 mg by mouth daily.      . valsartan (DIOVAN) 160 MG tablet Take 160 mg by mouth daily.      . Vitamin D, Ergocalciferol, (DRISDOL) 50000 UNITS CAPS capsule Take 50,000 Units by mouth every 7 (seven) days.       No current facility-administered medications for this visit.    LABS: Lab Results  Component Value Date   HGB 13.6  04/15/2013   HCT 40.0 04/15/2013      Component Value Date/Time   NA 133* 04/15/2013 0928   K 4.9 04/15/2013 0928   CL 104 04/15/2013 0928   GLUCOSE 480* 04/15/2013 0928   BUN 33* 04/15/2013 0928   CREATININE 1.30* 04/15/2013  0928   No results found for this basename: INR, PROTIME   No results found for this basename: PTT    SOCIAL HISTORY: History   Social History  . Marital Status: Divorced    Spouse Name: N/A    Number of Children: 2  . Years of Education: N/A   Occupational History  . Not on file.   Social History Main Topics  . Smoking status: Current Every Day Smoker -- 0.50 packs/day for 40 years    Types: Cigarettes    Start date: 02/13/1974  . Smokeless tobacco: Never Used  . Alcohol Use: No     Comment: rare  . Drug Use: No  . Sexual Activity: Not on file   Other Topics Concern  . Not on file   Social History Narrative   Lives in [place] with sister.  Unemployed    FAMILY HISTORY: Family History  Problem Relation Age of Onset  . Hyperlipidemia Mother   . Hypertension Mother   . Heart disease Mother   . Hyperlipidemia Father   . Diabetes Father     DM 2   . Hypertension Father   . Heart disease Father      REVIEW OF SYSTEMS: Reviewed with the patient and positive as above.    DENTAL HISTORY: CHIEF COMPLAINT: Patient was referred by Dr. Roxy Manns for a pre-heart valve surgery dental protocol examination.  HPI: Audrey Olson is a 53 year old female recently diagnosed with heart failure and mitral regurgitation. Patient with anticipated heart valve surgery with Dr. Roxy Manns. Patient is now seen as part of a pre-heart valve surgery dental protocol examination to rule out dental infection that may affect the patient's systemic health, anticipated heart valve surgery, and put the patient at risk for bacterial endocarditis.  The patient currently denies acute toothache, swellings, or abscesses. Patient knows that she has several" broken teeth". Patient points to  the upper left, lower left, and lower right as quadrants with broken teeth.  She has not seen a dentist since 2000. Patient had a tooth pulled at that time with no complications. Patient cannot remember the name of the dentist that she saw at that time. The patient does not have a regular primary dentist at this time. The patient has no partial dentures.  DENTAL EXAMINATION:  GENERAL: Patient is a well-developed, obese female in no acute distress. HEAD AND NECK: There is no palpable submandibular lymphadenopathy. The patient denies acute TMJ symptoms. INTRAORAL EXAM: The patient has xerostomia. There is no evidence of intraoral abscess formation at this time. DENTITION: Patient is missing tooth numbers 1, 2, 14, 16, 17, 18, 31, and 32.  Tooth numbers 12 and 30 are present as retained root segments. Patient has excessive maxillary and mandibular incisal attrition. PERIODONTAL: The patient has chronic periodontitis with plaque and calculus accumulations, gingival recession, and incipient tooth mobility. There is incipient to moderate bone loss noted. Radiographic calculus is noted. DENTAL CARIES/SUBOPTIMAL RESTORATIONS: Dental caries are noted as per dental charting form. The caries on tooth #19 are extensive and appear to be impinging on the pulp. ENDODONTIC: Patient currently denies acute pulpitis symptoms. Patient does have periapical pathology and radiolucency associated with the apices of tooth numbers 12 and 30. CROWN AND BRIDGE: There are no crown restorations noted. PROSTHODONTIC: Patient denies having any partial dentures. OCCLUSION: Patient has multiple missing teeth, supra-eruption and drifting of the unopposed teeth into the edentulous areas, and lack of replacement of missing teeth with dental prostheses.  RADIOGRAPHIC INTERPRETATION: An  orthopantogram was obtained. This was supplemented with 10 periapical radiographs. The radiographs were difficult to obtain secondary to poor patient  cooperation There multiple missing teeth. There are retained roots in the area tooth numbers 12 and 30. Dental caries are noted. There is incipient to moderate bone loss. Radiographic calculus is noted. There is supra-eruption and drifting of the unopposed teeth into the edentulous areas.  ASSESSMENTS: 1. Mitral valve regurgitation-with anticipated mitral valve replacement with Dr. Roxy Manns. 2. Pre-heart valve surgery dental protocol examination 3. Chronic apical periodontitis 4. Multiple retained root segments 5. Dental caries 6. Xerostomia 7. Chronic periodontitis of bone loss 8. Accretions 9. Gingival recession  10. Incipient tooth mobility 11. Multiple missing teeth 12. Supra-eruption and drifting of the unopposed teeth into the edentulous areas 13. No history of partial dentures 14. Poor occlusal scheme and malocclusion 15. Generalized the maxillary and mandibular incisal attrition. 16. Potential complications up to and including death with anticipated invasive dental procedures in the operating room due to overall cardiovascular compromise.   PLAN/RECOMMENDATIONS: 1. I discussed the risks, benefits, and complications of various treatment options with the patient in relationship to the medical and dental conditions, possible mitral valve replacement, and risk for bacterial endocarditis.  We discussed various treatment options to include no treatment, multiple extractions with alveoloplasty, pre-prosthetic surgery as indicated, periodontal therapy, dental restorations, root canal therapy, crown and bridge therapy, implant therapy, and replacement of missing teeth as indicated. The patient currently wishes to proceed with  extraction of tooth numbers 12, 19, and 30 with alveoloplasty and gross debridement of remaining dentition in the operating room with general anesthesia pending discussion with Dr. Minna Merritts. Rayann Heman.  The patient will then be referred to a general dentist for routine dental  care and maintenance of her dentition as well as evaluation for replacement of missing teeth as indicated after adequate healing.  Patient will be considered for prescription strength fluoride toothpaste therapy based on Medicaid coverage.   2. Discussion of findings with medical team and coordination of future medical and dental care as needed.  I spent 75 minutes face to face with patient and more than 50% of time was spent in counseling and /or coordination of care.   [name], DDS

## 2013-05-01 NOTE — Patient Instructions (Signed)
The patient agrees to proceed with extraction of tooth numbers 12, 19, and 30 with alveoloplasty and gross debridement of remaining teeth in the operating room. I will need to obtain medical clearance by Dr. Roxy Manns  or Dr. Rayann Heman prior to scheduling this procedure. In the meantime, the patient is to brush her teeth after meals and at bedtime. I will send a prescription for fluoride toothpaste to help prevent cavity secondary to severe xerostomia. Patient to call if acute problems arise before then. Dr. Enrique Sack

## 2013-05-14 ENCOUNTER — Other Ambulatory Visit (HOSPITAL_COMMUNITY): Payer: Self-pay | Admitting: Dentistry

## 2013-05-15 ENCOUNTER — Encounter (HOSPITAL_COMMUNITY): Payer: Self-pay | Admitting: Pharmacy Technician

## 2013-05-19 ENCOUNTER — Encounter (HOSPITAL_COMMUNITY): Payer: Self-pay

## 2013-05-19 NOTE — Progress Notes (Addendum)
Anesthesia PAT Evaluation:  Patient is a 53 year old female scheduled for multiple extraction with alveoloplasty with gross debridement 05/21/13 by Dr. Enrique Sack.  History includes non-ischemic cardiomyopathy, CHF, mitral regurgitation, mild OSA (can't tolerate CPAP), CKD stage III, DM2 with neuropathy, HTN, HLD, smoking, fatty liver/pancreas (she's unaware of this; said she had enlarged liver following an experimental drug for bone growth as a child), benign essential tremor, anemia, hemangioma of the spleen, anxiety with panic attacks, reflux, RLS. History also lists alpha-1-antitrypsin deficiency, but she is not aware of this diagnosis, although she did say that she has "hereditary COPD" followed by Dr. Benjaman Lobe (notes pending). BMI is 40 consistent with morbid obesity.  PCP is Dr. Glendon Axe.  Primary cardiologist is Dr. Lawson Radar with [facility]). She was seen by Dr. Thompson Grayer for EP consult in 03/2013. He deferred decision re: possible future ICD until after dental extraction and if she ultimately requires mitral valve surgery then he will await until after this is done to see how her EF will respond to surgery.  Echo on 04/15/13 showed: - Left ventricle: There is global hypokinesis and akinesis of the basal and mid anteroseptal, inferoseptal and anterior walls. Wall thickness was normal. Systolic function was severely reduced. The estimated ejection fraction was in the range of 25% to 30%. Doppler parameters are consistent with an restrictive pattern, indicative of decreased left ventricular diastolic compliance and/or increased left atrial pressure (grade 4 diastolic dysfunction). Doppler parameters are consistent with elevated ventricular end-diastolic filling pressure. - Aortic valve: Trileaflet; normal thickness leaflets. Trivial regurgitation. - Aortic root: The aortic root was normal in size. - Mitral valve: Trivial regurgitation. - Left atrium: The atrium was  moderately dilated. - Right ventricle: Systolic function was normal. - Tricuspid valve: No regurgitation. - Pulmonary arteries: Systolic pressure was within the normal range. - Inferior vena cava: The vessel was normal in size. - Pericardium, extracardiac: There was no pericardial effusion. (Previous TEE on 02/27/13 at [facility] showed EF 30-33% with moderate to severe MR.)  She had an abnormal nuclear stress test on 08/28/12 ([facility]) that showed no definite ischemia, but LV dilatation and global hypokinesis with near akinesis of the septal wall with calculated EF of 19%. Fixed (non-reversible) defect in the anterior septum beginning in the mid ventricle extending to the apex consistent with an area of prior infarct/scarring. She subsequently, underwent cardiiac cath on 12/09/2012 ([facility]) that showed normal coronary arteries. Moderate global LV systolic dysfunction. LVEF 35%. Medical therapy for LV dysfunction recommended.  EKG on 05/12/13 showed NSR, septal infarct (age undetermined).  Carotid duplex on 09/10/12 [facility]) showed: Mild, less than or equal to 39% right internal carotid artery stenosis. Normal right and left ICA/CCA ratio is. Normal left antegrade vertebral flow. Unable to visualize the right vertebral artery.  CXR on 07/22/12 [facility]) showed Normal heart size, no active disease.  CTA of the chest/aorta on 04/15/13 noted.  PFTs from 04/15/13 showed FVC 2.37 (69%), FEV1 1.59 (59%), FEF25-75% 0.88 (33%), TLC 4.08 *83%), DLCOunc 16.81 (73%).  Preoperative labs noted.  BUN/Cr 37/1.30.  Non-fasting glucose was 406. She told me her fasting glucose levels run as low as 165, but typically average 185 - mid 200's.  H/H 11.8/34.7.    She is a pleasant, obese, Caucasian female in NAD.  She denies chest pain or syncope.  She denies SOB at rest, but has SOB with mild activity which she says is stable.  She can walk about 1/2 block and do  her own house work and grocery shopping. Activity is also limited by bilateral  hip pain.  She does not require supplemental O2 at home. She has chronic BLE edema which she also feels is stable. Exam showed her heart rate in the 110's, regular.  I did not appreciate a significant murmur.  Lung sounds slightly diminished on left base but otherwise clear.  Generalized BLE pretibial edema.    I did update anesthesiologist Dr. Chriss Driver regarding her history/labs. I also spoke with Dr. Enrique Sack regarding labs.  He is anticipating GA with oral tube with overnight stay.  He may be able to do procedure with MAC is needed, but thinks she will likely need GA. He has viewed her labs. Records from Dr. Claudie Leach are scanned under the Media tab. Records from Dr. Tamala Julian and Dr. Vicki Mallet are still pending. She will get a fasting CBG on arrival.  If result is acceptable and otherwise no acute changes then it is anticipated that she can proceed as planned.  George Hugh [facility] Short Stay Center/Anesthesiology Phone [phone] 05/20/2013 2:17 PM

## 2013-05-19 NOTE — Pre-Procedure Instructions (Signed)
Audrey Olson  05/19/2013   Your procedure is scheduled on:  Wed, April 8 @ 8:30 AM  Report to [facility]  at 6:30 AM.  Call this number if you have problems the morning of surgery: [phone]   Remember:   Do not eat food or drink liquids after midnight.   Take these medicines the morning of surgery with A SIP OF WATER: Albuterol<Bring Your Inhaler With You>,Carvedilol(Coreg),Omeprazole(Prilosec),and Pain Pill(if needed)               No Goody's,BC's,Aleve,Ibuprofen,Fish Oil,or any Herbal Medications                 Do not wear jewelry, make-up or nail polish.  Do not wear lotions, powders, or perfumes. You may wear deodorant.  Do not shave 48 hours prior to surgery.   Do not bring valuables to the hospital.  [facility] is not responsible                  for any belongings or valuables.               Contacts, dentures or bridgework may not be worn into surgery.  Leave suitcase in the car. After surgery it may be brought to your room.  For patients admitted to the hospital, discharge time is determined by your                treatment team.               Patients discharged the day of surgery will not be allowed to drive  home.    Special Instructions:  [facility] - Preparing for Surgery  Before surgery, you can play an important role.  Because skin is not sterile, your skin needs to be as free of germs as possible.  You can reduce the number of germs on you skin by washing with CHG (chlorahexidine gluconate) soap before surgery.  CHG is an antiseptic cleaner which kills germs and bonds with the skin to continue killing germs even after washing.  Please DO NOT use if you have an allergy to CHG or antibacterial soaps.  If your skin becomes reddened/irritated stop using the CHG and inform your nurse when you arrive at Short Stay.  Do not shave (including legs and underarms) for at least 48 hours prior to the first CHG shower.  You may shave your face.  Please follow  these instructions carefully:   1.  Shower with CHG Soap the night before surgery and the                                morning of Surgery.  2.  If you choose to wash your hair, wash your hair first as usual with your       normal shampoo.  3.  After you shampoo, rinse your hair and body thoroughly to remove the                      Shampoo.  4.  Use CHG as you would any other liquid soap.  You can apply chg directly       to the skin and wash gently with scrungie or a clean washcloth.  5.  Apply the CHG Soap to your body ONLY FROM THE NECK DOWN.        Do not use on open wounds or open  sores.  Avoid contact with your eyes,       ears, mouth and genitals (private parts).  Wash genitals (private parts)       with your normal soap.  6.  Wash thoroughly, paying special attention to the area where your surgery        will be performed.  7.  Thoroughly rinse your body with warm water from the neck down.  8.  DO NOT shower/wash with your normal soap after using and rinsing off       the CHG Soap.  9.  Pat yourself dry with a clean towel.            10.  Wear clean pajamas.            11.  Place clean sheets on your bed the night of your first shower and do not        sleep with pets.  Day of Surgery  Do not apply any lotions/deoderants the morning of surgery.  Please wear clean clothes to the hospital/surgery center.     Please read over the following fact sheets that you were given: Pain Booklet, Coughing and Deep Breathing and Surgical Site Infection Prevention

## 2013-05-19 NOTE — Progress Notes (Addendum)
Cardiologist is Dr.Allred with last visit in epic from 04/2013  Stress test in epic from 08/2012  Echo reports in epic from 2014/2015  Heart cath in epic from 2014  EKG in epic from 04-15-13  Medical Md is Dr.Karla Tamala Julian  CXR to be requested from [facility]

## 2013-05-20 ENCOUNTER — Encounter (HOSPITAL_COMMUNITY): Payer: Self-pay

## 2013-05-20 ENCOUNTER — Encounter (HOSPITAL_COMMUNITY)
Admission: RE | Admit: 2013-05-20 | Discharge: 2013-05-20 | Disposition: A | Discharge status: Home / Self Care | Payer: Medicaid Other | Source: Ambulatory Visit | Attending: Dentistry | Admitting: Dentistry

## 2013-05-20 HISTORY — DX: Headache: R51

## 2013-05-20 HISTORY — DX: Bell's palsy: G51.0

## 2013-05-20 HISTORY — DX: Dorsalgia, unspecified: M54.9

## 2013-05-20 HISTORY — DX: Personal history of other diseases of the digestive system: Z87.19

## 2013-05-20 HISTORY — DX: Personal history of colon polyps, unspecified: Z86.0100

## 2013-05-20 HISTORY — DX: Personal history of colonic polyps: Z86.010

## 2013-05-20 HISTORY — DX: Allergy status to unspecified drugs, medicaments and biological substances status: Z88.9

## 2013-05-20 HISTORY — DX: Polyneuropathy, unspecified: G62.9

## 2013-05-20 HISTORY — DX: Personal history of Methicillin resistant Staphylococcus aureus infection: Z86.14

## 2013-05-20 HISTORY — DX: Personal history of other infectious and parasitic diseases: Z86.19

## 2013-05-20 HISTORY — DX: Personal history of peptic ulcer disease: Z87.11

## 2013-05-20 HISTORY — DX: Effusion, unspecified joint: M25.40

## 2013-05-20 HISTORY — DX: Fibromyalgia: M79.7

## 2013-05-20 LAB — COMPREHENSIVE METABOLIC PANEL
ALBUMIN: 2.8 g/dL — AB (ref 3.5–5.2)
ALK PHOS: 123 U/L — AB (ref 39–117)
ALT: 14 U/L (ref 0–35)
AST: 9 U/L (ref 0–37)
BUN: 37 mg/dL — ABNORMAL HIGH (ref 6–23)
CHLORIDE: 95 meq/L — AB (ref 96–112)
CO2: 24 mEq/L (ref 19–32)
Calcium: 9.5 mg/dL (ref 8.4–10.5)
Creatinine, Ser: 1.31 mg/dL — ABNORMAL HIGH (ref 0.50–1.10)
GFR calc Af Amer: 54 mL/min — ABNORMAL LOW (ref >90)
GFR calc non Af Amer: 46 mL/min — ABNORMAL LOW (ref >90)
Glucose, Bld: 406 mg/dL — ABNORMAL HIGH (ref 70–99)
POTASSIUM: 4.8 meq/L (ref 3.7–5.3)
SODIUM: 136 meq/L — AB (ref 137–147)
Total Bilirubin: 0.2 mg/dL — ABNORMAL LOW (ref 0.3–1.2)
Total Protein: 6.8 g/dL (ref 6.0–8.3)

## 2013-05-20 LAB — CBC
HEMATOCRIT: 34.7 % — AB (ref 36.0–46.0)
Hemoglobin: 11.8 g/dL — ABNORMAL LOW (ref 12.0–15.0)
MCH: 31.6 pg (ref 26.0–34.0)
MCHC: 34 g/dL (ref 30.0–36.0)
MCV: 93 fL (ref 78.0–100.0)
Platelets: 253 10*3/uL (ref 150–400)
RBC: 3.73 MIL/uL — ABNORMAL LOW (ref 3.87–5.11)
RDW: 14.4 % (ref 11.5–15.5)
WBC: 13.6 10*3/uL — ABNORMAL HIGH (ref 4.0–10.5)

## 2013-05-20 MED ORDER — CEFAZOLIN SODIUM-DEXTROSE 2-3 GM-% IV SOLR
2.0000 g | Freq: Once | INTRAVENOUS | Status: AC
  Administered 2013-05-21: 2 g via INTRAVENOUS
  Filled 2013-05-20: qty 50

## 2013-05-20 NOTE — Progress Notes (Signed)
Fasting blood sugar runs 300

## 2013-05-20 NOTE — Progress Notes (Signed)
Sees Dr.Beauford at [facility] with last visit several months ago

## 2013-05-20 NOTE — Progress Notes (Signed)
Sleep study was done about a yr ago and to be requested from [facility] but doesn't use a cpap

## 2013-05-21 ENCOUNTER — Observation Stay (HOSPITAL_COMMUNITY): Payer: Medicaid Other

## 2013-05-21 ENCOUNTER — Encounter (HOSPITAL_COMMUNITY)
Admission: RE | Disposition: A | Discharge status: Home Health | Payer: Self-pay | Source: Ambulatory Visit | Attending: Cardiovascular Disease

## 2013-05-21 ENCOUNTER — Inpatient Hospital Stay (HOSPITAL_COMMUNITY): Payer: Medicaid Other | Admitting: Certified Registered"

## 2013-05-21 ENCOUNTER — Inpatient Hospital Stay (HOSPITAL_COMMUNITY)
Admission: RE | Admit: 2013-05-21 | Discharge: 2013-05-23 | DRG: 137 | Disposition: A | Discharge status: Home Health | Payer: Medicaid Other | Source: Ambulatory Visit | Attending: Cardiovascular Disease | Admitting: Cardiovascular Disease

## 2013-05-21 ENCOUNTER — Encounter (HOSPITAL_COMMUNITY): Payer: Medicaid Other | Admitting: Vascular Surgery

## 2013-05-21 ENCOUNTER — Encounter (HOSPITAL_COMMUNITY): Payer: Self-pay | Admitting: *Deleted

## 2013-05-21 DIAGNOSIS — I059 Rheumatic mitral valve disease, unspecified: Secondary | ICD-10-CM | POA: Diagnosis present

## 2013-05-21 DIAGNOSIS — K219 Gastro-esophageal reflux disease without esophagitis: Secondary | ICD-10-CM | POA: Diagnosis present

## 2013-05-21 DIAGNOSIS — J449 Chronic obstructive pulmonary disease, unspecified: Secondary | ICD-10-CM | POA: Diagnosis present

## 2013-05-21 DIAGNOSIS — E785 Hyperlipidemia, unspecified: Secondary | ICD-10-CM | POA: Diagnosis present

## 2013-05-21 DIAGNOSIS — I10 Essential (primary) hypertension: Secondary | ICD-10-CM

## 2013-05-21 DIAGNOSIS — I5042 Chronic combined systolic (congestive) and diastolic (congestive) heart failure: Secondary | ICD-10-CM

## 2013-05-21 DIAGNOSIS — N186 End stage renal disease: Secondary | ICD-10-CM | POA: Diagnosis present

## 2013-05-21 DIAGNOSIS — F17200 Nicotine dependence, unspecified, uncomplicated: Secondary | ICD-10-CM | POA: Diagnosis present

## 2013-05-21 DIAGNOSIS — N189 Chronic kidney disease, unspecified: Secondary | ICD-10-CM

## 2013-05-21 DIAGNOSIS — F419 Anxiety disorder, unspecified: Secondary | ICD-10-CM

## 2013-05-21 DIAGNOSIS — L02214 Cutaneous abscess of groin: Secondary | ICD-10-CM

## 2013-05-21 DIAGNOSIS — L03319 Cellulitis of trunk, unspecified: Secondary | ICD-10-CM

## 2013-05-21 DIAGNOSIS — K036 Deposits [accretions] on teeth: Secondary | ICD-10-CM | POA: Diagnosis present

## 2013-05-21 DIAGNOSIS — J4489 Other specified chronic obstructive pulmonary disease: Secondary | ICD-10-CM | POA: Diagnosis present

## 2013-05-21 DIAGNOSIS — K083 Retained dental root: Secondary | ICD-10-CM | POA: Diagnosis present

## 2013-05-21 DIAGNOSIS — Z113 Encounter for screening for infections with a predominantly sexual mode of transmission: Secondary | ICD-10-CM

## 2013-05-21 DIAGNOSIS — K045 Chronic apical periodontitis: Principal | ICD-10-CM | POA: Diagnosis present

## 2013-05-21 DIAGNOSIS — R509 Fever, unspecified: Secondary | ICD-10-CM

## 2013-05-21 DIAGNOSIS — E7800 Pure hypercholesterolemia, unspecified: Secondary | ICD-10-CM | POA: Diagnosis present

## 2013-05-21 DIAGNOSIS — I428 Other cardiomyopathies: Secondary | ICD-10-CM | POA: Diagnosis present

## 2013-05-21 DIAGNOSIS — E559 Vitamin D deficiency, unspecified: Secondary | ICD-10-CM | POA: Diagnosis present

## 2013-05-21 DIAGNOSIS — G8929 Other chronic pain: Secondary | ICD-10-CM | POA: Diagnosis present

## 2013-05-21 DIAGNOSIS — Z7982 Long term (current) use of aspirin: Secondary | ICD-10-CM

## 2013-05-21 DIAGNOSIS — K047 Periapical abscess without sinus: Secondary | ICD-10-CM

## 2013-05-21 DIAGNOSIS — E1142 Type 2 diabetes mellitus with diabetic polyneuropathy: Secondary | ICD-10-CM

## 2013-05-21 DIAGNOSIS — M1990 Unspecified osteoarthritis, unspecified site: Secondary | ICD-10-CM

## 2013-05-21 DIAGNOSIS — IMO0001 Reserved for inherently not codable concepts without codable children: Secondary | ICD-10-CM | POA: Diagnosis present

## 2013-05-21 DIAGNOSIS — K029 Dental caries, unspecified: Secondary | ICD-10-CM | POA: Diagnosis present

## 2013-05-21 DIAGNOSIS — E1121 Type 2 diabetes mellitus with diabetic nephropathy: Secondary | ICD-10-CM

## 2013-05-21 DIAGNOSIS — F411 Generalized anxiety disorder: Secondary | ICD-10-CM | POA: Diagnosis present

## 2013-05-21 DIAGNOSIS — I129 Hypertensive chronic kidney disease with stage 1 through stage 4 chronic kidney disease, or unspecified chronic kidney disease: Secondary | ICD-10-CM | POA: Diagnosis present

## 2013-05-21 DIAGNOSIS — Z8614 Personal history of Methicillin resistant Staphylococcus aureus infection: Secondary | ICD-10-CM

## 2013-05-21 DIAGNOSIS — I509 Heart failure, unspecified: Secondary | ICD-10-CM | POA: Diagnosis present

## 2013-05-21 DIAGNOSIS — Z6841 Body Mass Index (BMI) 40.0 and over, adult: Secondary | ICD-10-CM

## 2013-05-21 DIAGNOSIS — N058 Unspecified nephritic syndrome with other morphologic changes: Secondary | ICD-10-CM

## 2013-05-21 DIAGNOSIS — M810 Age-related osteoporosis without current pathological fracture: Secondary | ICD-10-CM | POA: Diagnosis present

## 2013-05-21 DIAGNOSIS — E1129 Type 2 diabetes mellitus with other diabetic kidney complication: Secondary | ICD-10-CM

## 2013-05-21 DIAGNOSIS — L02219 Cutaneous abscess of trunk, unspecified: Secondary | ICD-10-CM | POA: Diagnosis present

## 2013-05-21 DIAGNOSIS — E119 Type 2 diabetes mellitus without complications: Secondary | ICD-10-CM | POA: Diagnosis present

## 2013-05-21 DIAGNOSIS — G4733 Obstructive sleep apnea (adult) (pediatric): Secondary | ICD-10-CM

## 2013-05-21 DIAGNOSIS — Z720 Tobacco use: Secondary | ICD-10-CM | POA: Diagnosis present

## 2013-05-21 DIAGNOSIS — E1140 Type 2 diabetes mellitus with diabetic neuropathy, unspecified: Secondary | ICD-10-CM

## 2013-05-21 DIAGNOSIS — I340 Nonrheumatic mitral (valve) insufficiency: Secondary | ICD-10-CM

## 2013-05-21 DIAGNOSIS — E1149 Type 2 diabetes mellitus with other diabetic neurological complication: Secondary | ICD-10-CM

## 2013-05-21 DIAGNOSIS — Z794 Long term (current) use of insulin: Secondary | ICD-10-CM

## 2013-05-21 DIAGNOSIS — F410 Panic disorder [episodic paroxysmal anxiety] without agoraphobia: Secondary | ICD-10-CM | POA: Diagnosis present

## 2013-05-21 DIAGNOSIS — M129 Arthropathy, unspecified: Secondary | ICD-10-CM | POA: Diagnosis present

## 2013-05-21 HISTORY — PX: MULTIPLE TOOTH EXTRACTIONS: SHX2053

## 2013-05-21 HISTORY — PX: MULTIPLE EXTRACTIONS WITH ALVEOLOPLASTY: SHX5342

## 2013-05-21 LAB — GLUCOSE, CAPILLARY
GLUCOSE-CAPILLARY: 172 mg/dL — AB (ref 70–99)
GLUCOSE-CAPILLARY: 304 mg/dL — AB (ref 70–99)
Glucose-Capillary: 166 mg/dL — ABNORMAL HIGH (ref 70–99)
Glucose-Capillary: 138 mg/dL — ABNORMAL HIGH (ref 70–99)

## 2013-05-21 SURGERY — MULTIPLE EXTRACTION WITH ALVEOLOPLASTY
Anesthesia: General | Site: Mouth

## 2013-05-21 MED ORDER — PHENYLEPHRINE HCL 10 MG/ML IJ SOLN
10.0000 mg | INTRAVENOUS | Status: DC | PRN
  Administered 2013-05-21: 50 ug/min via INTRAVENOUS

## 2013-05-21 MED ORDER — HEMOSTATIC AGENTS (NO CHARGE) OPTIME
TOPICAL | Status: DC | PRN
  Administered 2013-05-21: 1 via TOPICAL

## 2013-05-21 MED ORDER — HYDROMORPHONE HCL PF 1 MG/ML IJ SOLN
INTRAMUSCULAR | Status: AC
  Filled 2013-05-21: qty 1

## 2013-05-21 MED ORDER — INSULIN ASPART 100 UNIT/ML ~~LOC~~ SOLN
0.0000 [IU] | Freq: Three times a day (TID) | SUBCUTANEOUS | Status: DC
Start: 2013-05-21 — End: 2013-05-23
  Administered 2013-05-21: 3 [IU] via SUBCUTANEOUS
  Administered 2013-05-22: 5 [IU] via SUBCUTANEOUS
  Administered 2013-05-22: 2 [IU] via SUBCUTANEOUS
  Administered 2013-05-22 – 2013-05-23 (×2): 3 [IU] via SUBCUTANEOUS
  Administered 2013-05-23: 2 [IU] via SUBCUTANEOUS

## 2013-05-21 MED ORDER — NEOSTIGMINE METHYLSULFATE 1 MG/ML IJ SOLN
INTRAMUSCULAR | Status: DC | PRN
  Administered 2013-05-21: 2 mg via INTRAVENOUS
  Administered 2013-05-21: 3 mg via INTRAVENOUS

## 2013-05-21 MED ORDER — ROCURONIUM BROMIDE 50 MG/5ML IV SOLN
INTRAVENOUS | Status: AC
  Filled 2013-05-21: qty 1

## 2013-05-21 MED ORDER — ONDANSETRON HCL 4 MG/2ML IJ SOLN
INTRAMUSCULAR | Status: DC | PRN
  Administered 2013-05-21: 4 mg via INTRAVENOUS

## 2013-05-21 MED ORDER — SUCCINYLCHOLINE CHLORIDE 20 MG/ML IJ SOLN
INTRAMUSCULAR | Status: DC | PRN
  Administered 2013-05-21: 100 mg via INTRAVENOUS
  Administered 2013-05-21: 80 mg via INTRAVENOUS

## 2013-05-21 MED ORDER — OXYMETAZOLINE HCL 0.05 % NA SOLN
NASAL | Status: AC
Start: 2013-05-21 — End: 2013-05-21
  Filled 2013-05-21: qty 15

## 2013-05-21 MED ORDER — GLYCOPYRROLATE 0.2 MG/ML IJ SOLN
INTRAMUSCULAR | Status: DC | PRN
  Administered 2013-05-21: 0.2 mg via INTRAVENOUS
  Administered 2013-05-21: .4 mg via INTRAVENOUS

## 2013-05-21 MED ORDER — CARVEDILOL 25 MG PO TABS
25.0000 mg | ORAL_TABLET | Freq: Two times a day (BID) | ORAL | Status: DC
  Administered 2013-05-21 – 2013-05-23 (×5): 25 mg via ORAL
  Filled 2013-05-21 (×8): qty 1

## 2013-05-21 MED ORDER — MORPHINE SULFATE 2 MG/ML IJ SOLN
2.0000 mg | INTRAMUSCULAR | Status: DC | PRN
  Administered 2013-05-22: 2 mg via INTRAVENOUS
  Filled 2013-05-21: qty 1

## 2013-05-21 MED ORDER — ARTIFICIAL TEARS OP OINT
TOPICAL_OINTMENT | OPHTHALMIC | Status: AC
  Filled 2013-05-21: qty 3.5

## 2013-05-21 MED ORDER — STERILE WATER FOR INJECTION IJ SOLN
INTRAMUSCULAR | Status: AC
  Filled 2013-05-21: qty 10

## 2013-05-21 MED ORDER — OXYMETAZOLINE HCL 0.05 % NA SOLN: NASAL | Status: DC | PRN

## 2013-05-21 MED ORDER — PROPOFOL 10 MG/ML IV BOLUS
INTRAVENOUS | Status: DC | PRN
  Administered 2013-05-21: 150 mg via INTRAVENOUS
  Administered 2013-05-21: 50 mg via INTRAVENOUS

## 2013-05-21 MED ORDER — ALBUTEROL SULFATE (2.5 MG/3ML) 0.083% IN NEBU: 2.5000 mg | INHALATION_SOLUTION | Freq: Four times a day (QID) | RESPIRATORY_TRACT | Status: DC | PRN

## 2013-05-21 MED ORDER — EPHEDRINE SULFATE 50 MG/ML IJ SOLN
INTRAMUSCULAR | Status: AC
  Filled 2013-05-21: qty 1

## 2013-05-21 MED ORDER — ONDANSETRON HCL 4 MG/2ML IJ SOLN
INTRAMUSCULAR | Status: AC
  Filled 2013-05-21: qty 2

## 2013-05-21 MED ORDER — VANCOMYCIN HCL 10 G IV SOLR
1500.0000 mg | INTRAVENOUS | Status: DC
  Administered 2013-05-22: 1500 mg via INTRAVENOUS
  Filled 2013-05-21 (×2): qty 1500

## 2013-05-21 MED ORDER — NITROGLYCERIN 0.4 MG SL SUBL: 0.4000 mg | SUBLINGUAL_TABLET | SUBLINGUAL | Status: DC | PRN

## 2013-05-21 MED ORDER — OMEGA-3-ACID ETHYL ESTERS 1 G PO CAPS
4.0000 g | ORAL_CAPSULE | Freq: Every day | ORAL | Status: DC
  Administered 2013-05-21 – 2013-05-23 (×3): 4 g via ORAL
  Filled 2013-05-21 (×3): qty 4

## 2013-05-21 MED ORDER — MIDAZOLAM HCL 5 MG/5ML IJ SOLN
INTRAMUSCULAR | Status: DC | PRN
  Administered 2013-05-21: 2 mg via INTRAVENOUS

## 2013-05-21 MED ORDER — VANCOMYCIN HCL 10 G IV SOLR
2000.0000 mg | Freq: Once | INTRAVENOUS | Status: AC
  Administered 2013-05-22: 2000 mg via INTRAVENOUS
  Filled 2013-05-21: qty 2000

## 2013-05-21 MED ORDER — FLUTICASONE PROPIONATE 50 MCG/ACT NA SUSP
2.0000 | Freq: Every evening | NASAL | Status: DC | PRN
  Filled 2013-05-21: qty 16

## 2013-05-21 MED ORDER — LIDOCAINE-EPINEPHRINE 2 %-1:100000 IJ SOLN
INTRAMUSCULAR | Status: DC | PRN
  Administered 2013-05-21: 1.7 mL via PERINEURAL

## 2013-05-21 MED ORDER — PROMETHAZINE HCL 25 MG/ML IJ SOLN: 6.2500 mg | INTRAMUSCULAR | Status: DC | PRN

## 2013-05-21 MED ORDER — ACETAMINOPHEN 325 MG PO TABS
650.0000 mg | ORAL_TABLET | Freq: Four times a day (QID) | ORAL | Status: DC | PRN
  Administered 2013-05-21 – 2013-05-22 (×2): 650 mg via ORAL
  Filled 2013-05-21 (×2): qty 2

## 2013-05-21 MED ORDER — LACTATED RINGERS IV SOLN
INTRAVENOUS | Status: DC | PRN
Start: 2013-05-21 — End: 2013-05-21
  Administered 2013-05-21 (×2): via INTRAVENOUS

## 2013-05-21 MED ORDER — PREGABALIN 75 MG PO CAPS
150.0000 mg | ORAL_CAPSULE | Freq: Every evening | ORAL | Status: DC
  Administered 2013-05-21 – 2013-05-23 (×3): 150 mg via ORAL
  Filled 2013-05-21 (×3): qty 2

## 2013-05-21 MED ORDER — INSULIN GLARGINE 100 UNIT/ML ~~LOC~~ SOLN
80.0000 [IU] | Freq: Two times a day (BID) | SUBCUTANEOUS | Status: DC
  Administered 2013-05-21 – 2013-05-23 (×4): 80 [IU] via SUBCUTANEOUS
  Filled 2013-05-21 (×6): qty 0.8

## 2013-05-21 MED ORDER — LIDOCAINE HCL (CARDIAC) 20 MG/ML IV SOLN
INTRAVENOUS | Status: DC | PRN
  Administered 2013-05-21: 100 mg via INTRAVENOUS

## 2013-05-21 MED ORDER — METFORMIN HCL 500 MG PO TABS
1000.0000 mg | ORAL_TABLET | Freq: Two times a day (BID) | ORAL | Status: DC
  Administered 2013-05-21 – 2013-05-22 (×2): 1000 mg via ORAL
  Filled 2013-05-21 (×5): qty 2

## 2013-05-21 MED ORDER — LIDOCAINE HCL (CARDIAC) 20 MG/ML IV SOLN
INTRAVENOUS | Status: AC
  Filled 2013-05-21: qty 5

## 2013-05-21 MED ORDER — PIPERACILLIN-TAZOBACTAM 3.375 G IVPB
3.3750 g | Freq: Three times a day (TID) | INTRAVENOUS | Status: DC
  Administered 2013-05-22 (×3): 3.375 g via INTRAVENOUS
  Filled 2013-05-21 (×5): qty 50

## 2013-05-21 MED ORDER — OXYCODONE HCL 5 MG PO TABS: 5.0000 mg | ORAL_TABLET | Freq: Once | ORAL | Status: DC | PRN

## 2013-05-21 MED ORDER — GLYCOPYRROLATE 0.2 MG/ML IJ SOLN
INTRAMUSCULAR | Status: AC
  Filled 2013-05-21: qty 2

## 2013-05-21 MED ORDER — PANTOPRAZOLE SODIUM 40 MG PO TBEC
40.0000 mg | DELAYED_RELEASE_TABLET | Freq: Every day | ORAL | Status: DC
  Administered 2013-05-21 – 2013-05-23 (×3): 40 mg via ORAL
  Filled 2013-05-21 (×3): qty 1

## 2013-05-21 MED ORDER — GLYCOPYRROLATE 0.2 MG/ML IJ SOLN
INTRAMUSCULAR | Status: AC
  Filled 2013-05-21: qty 1

## 2013-05-21 MED ORDER — ALBUTEROL SULFATE HFA 108 (90 BASE) MCG/ACT IN AERS: 2.0000 | INHALATION_SPRAY | Freq: Four times a day (QID) | RESPIRATORY_TRACT | Status: DC | PRN

## 2013-05-21 MED ORDER — ATORVASTATIN CALCIUM 80 MG PO TABS
80.0000 mg | ORAL_TABLET | Freq: Every day | ORAL | Status: DC
  Administered 2013-05-21 – 2013-05-23 (×3): 80 mg via ORAL
  Filled 2013-05-21 (×3): qty 1

## 2013-05-21 MED ORDER — PHENYLEPHRINE 40 MCG/ML (10ML) SYRINGE FOR IV PUSH (FOR BLOOD PRESSURE SUPPORT)
PREFILLED_SYRINGE | INTRAVENOUS | Status: AC
  Filled 2013-05-21: qty 10

## 2013-05-21 MED ORDER — CEFAZOLIN SODIUM-DEXTROSE 2-3 GM-% IV SOLR
INTRAVENOUS | Status: AC
  Filled 2013-05-21: qty 50

## 2013-05-21 MED ORDER — HYDROCODONE-ACETAMINOPHEN 10-325 MG PO TABS
1.0000 | ORAL_TABLET | Freq: Four times a day (QID) | ORAL | Status: DC | PRN
  Administered 2013-05-22: 1 via ORAL
  Filled 2013-05-21: qty 1

## 2013-05-21 MED ORDER — FENTANYL CITRATE 0.05 MG/ML IJ SOLN
INTRAMUSCULAR | Status: AC
  Filled 2013-05-21: qty 5

## 2013-05-21 MED ORDER — LIDOCAINE-EPINEPHRINE 2 %-1:100000 IJ SOLN
INTRAMUSCULAR | Status: AC
  Filled 2013-05-21: qty 20.4

## 2013-05-21 MED ORDER — CLONAZEPAM 1 MG PO TABS: 1.0000 mg | ORAL_TABLET | Freq: Every evening | ORAL | Status: DC | PRN

## 2013-05-21 MED ORDER — LACTATED RINGERS IV SOLN: INTRAVENOUS | Status: DC

## 2013-05-21 MED ORDER — OXYCODONE HCL 5 MG/5ML PO SOLN: 5.0000 mg | Freq: Once | ORAL | Status: DC | PRN

## 2013-05-21 MED ORDER — 0.9 % SODIUM CHLORIDE (POUR BTL) OPTIME
TOPICAL | Status: DC | PRN
  Administered 2013-05-21: 1000 mL

## 2013-05-21 MED ORDER — IRBESARTAN 150 MG PO TABS
150.0000 mg | ORAL_TABLET | Freq: Every day | ORAL | Status: DC
  Administered 2013-05-21 – 2013-05-23 (×3): 150 mg via ORAL
  Filled 2013-05-21 (×3): qty 1

## 2013-05-21 MED ORDER — BUPIVACAINE-EPINEPHRINE 0.5% -1:200000 IJ SOLN
INTRAMUSCULAR | Status: DC | PRN
  Administered 2013-05-21: 5.4 mL

## 2013-05-21 MED ORDER — PROPOFOL 10 MG/ML IV BOLUS
INTRAVENOUS | Status: AC
  Filled 2013-05-21: qty 20

## 2013-05-21 MED ORDER — BUPIVACAINE-EPINEPHRINE (PF) 0.5% -1:200000 IJ SOLN
INTRAMUSCULAR | Status: AC
  Filled 2013-05-21: qty 10.8

## 2013-05-21 MED ORDER — OXYCODONE-ACETAMINOPHEN 5-325 MG PO TABS
1.0000 | ORAL_TABLET | ORAL | Status: DC | PRN
  Administered 2013-05-21 – 2013-05-23 (×2): 2 via ORAL
  Filled 2013-05-21 (×3): qty 2

## 2013-05-21 MED ORDER — MIDAZOLAM HCL 2 MG/2ML IJ SOLN
INTRAMUSCULAR | Status: AC
  Filled 2013-05-21: qty 2

## 2013-05-21 MED ORDER — ROCURONIUM BROMIDE 100 MG/10ML IV SOLN
INTRAVENOUS | Status: DC | PRN
  Administered 2013-05-21: 10 mg via INTRAVENOUS
  Administered 2013-05-21: 20 mg via INTRAVENOUS

## 2013-05-21 MED ORDER — FENTANYL CITRATE 0.05 MG/ML IJ SOLN
INTRAMUSCULAR | Status: DC | PRN
  Administered 2013-05-21 (×2): 50 ug via INTRAVENOUS

## 2013-05-21 MED ORDER — CYCLOBENZAPRINE HCL 10 MG PO TABS: 10.0000 mg | ORAL_TABLET | Freq: Three times a day (TID) | ORAL | Status: DC | PRN

## 2013-05-21 MED ORDER — FENTANYL CITRATE 0.05 MG/ML IJ SOLN: 25.0000 ug | INTRAMUSCULAR | Status: DC | PRN

## 2013-05-21 SURGICAL SUPPLY — 34 items
ALCOHOL 70% 16 OZ (MISCELLANEOUS) ×3 IMPLANT
ATTRACTOMAT 16X20 MAGNETIC DRP (DRAPES) ×3 IMPLANT
BLADE SURG 15 STRL LF DISP TIS (BLADE) ×1 IMPLANT
BLADE SURG 15 STRL SS (BLADE) ×2
COVER SURGICAL LIGHT HANDLE (MISCELLANEOUS) ×3 IMPLANT
CRADLE DONUT ADULT HEAD (MISCELLANEOUS) ×3 IMPLANT
GAUZE PACKING FOLDED 2  STR (GAUZE/BANDAGES/DRESSINGS) ×2
GAUZE PACKING FOLDED 2 STR (GAUZE/BANDAGES/DRESSINGS) ×1 IMPLANT
GAUZE SPONGE 4X4 16PLY XRAY LF (GAUZE/BANDAGES/DRESSINGS) ×3 IMPLANT
GLOVE BIOGEL PI IND STRL 6 (GLOVE) ×1 IMPLANT
GLOVE BIOGEL PI INDICATOR 6 (GLOVE) ×2
GLOVE SURG ORTHO 8.0 STRL STRW (GLOVE) ×3 IMPLANT
GLOVE SURG SS PI 6.0 STRL IVOR (GLOVE) ×3 IMPLANT
GOWN STRL REUS W/TWL 2XL LVL3 (GOWN DISPOSABLE) ×3 IMPLANT
HEMOSTAT SURGICEL .5X2 ABSORB (HEMOSTASIS) IMPLANT
KIT BASIN OR (CUSTOM PROCEDURE TRAY) ×3 IMPLANT
KIT ROOM TURNOVER OR (KITS) ×3 IMPLANT
MANIFOLD NEPTUNE WASTE (CANNULA) IMPLANT
NEEDLE BLUNT 16X1.5 OR ONLY (NEEDLE) ×3 IMPLANT
NEEDLE DENTAL 27 LONG (NEEDLE) IMPLANT
NS IRRIG 1000ML POUR BTL (IV SOLUTION) ×3 IMPLANT
PACK EENT II TURBAN DRAPE (CUSTOM PROCEDURE TRAY) ×3 IMPLANT
PAD ARMBOARD 7.5X6 YLW CONV (MISCELLANEOUS) ×6 IMPLANT
SPONGE SURGIFOAM ABS GEL 100 (HEMOSTASIS) IMPLANT
SPONGE SURGIFOAM ABS GEL 12-7 (HEMOSTASIS) IMPLANT
SPONGE SURGIFOAM ABS GEL SZ50 (HEMOSTASIS) IMPLANT
SUCTION FRAZIER TIP 10 FR DISP (SUCTIONS) ×3 IMPLANT
SUT CHROMIC 3 0 PS 2 (SUTURE) ×6 IMPLANT
SUT CHROMIC 4 0 P 3 18 (SUTURE) IMPLANT
SYR 50ML SLIP (SYRINGE) ×3 IMPLANT
TOWEL OR 17X26 10 PK STRL BLUE (TOWEL DISPOSABLE) ×3 IMPLANT
TUBE CONNECTING 12'X1/4 (SUCTIONS) ×1
TUBE CONNECTING 12X1/4 (SUCTIONS) ×2 IMPLANT
YANKAUER SUCT BULB TIP NO VENT (SUCTIONS) ×3 IMPLANT

## 2013-05-21 NOTE — Progress Notes (Signed)
Report given to robin rn as caregiver 

## 2013-05-21 NOTE — Progress Notes (Signed)
05/21/2013  Patient:            Audrey Olson Date of Birth:  10-06-1960 MRN:                [id number]  BP 160/65  Pulse 116  Temp(Src) 99.2 F (37.3 C) (Oral)  Resp 18  Wt 221 lb (100.245 kg)  SpO2 94%  PRE-OPERATIVE NOTE:  05/21/2013 Audrey Olson [id number]  VITALS: BP 160/65  Pulse 116  Temp(Src) 99.2 F (37.3 C) (Oral)  Resp 18  Wt 221 lb (100.245 kg)  SpO2 94%  Lab Results  Component Value Date   WBC 13.6* 05/20/2013   HGB 11.8* 05/20/2013   HCT 34.7* 05/20/2013   MCV 93.0 05/20/2013   PLT 253 05/20/2013   BMET    Component Value Date/Time   NA 136* 05/20/2013 0925   K 4.8 05/20/2013 0925   CL 95* 05/20/2013 0925   CO2 24 05/20/2013 0925   GLUCOSE 406* 05/20/2013 0925   BUN 37* 05/20/2013 0925   CREATININE 1.31* 05/20/2013 0925   CALCIUM 9.5 05/20/2013 0925   GFRNONAA 46* 05/20/2013 0925   GFRAA 54* 05/20/2013 0925    No results found for this basename: INR, PROTIME   No results found for this basename: PTT   CBG (last 3)   Recent Labs  05/21/13 [name] presents for  multiple dental extractions with alveoloplasty and gross debridement and dentition the operating room with general anesthesia.   SUBJECTIVE: The patient denies any acute medical or dental changes and agrees to proceed with treatment as planned.  EXAM: No sign of acute dental changes.  ASSESSMENT: Patient is affected by chronic apical periodontitis, multiple retained root segments, dental caries, chronic periodontitis, and accretions.  PLAN: Patient agrees to proceed with treatment as planned in the operating room as previously discussed and accepts the risks, benefits, complications of the proposed treatment. We discussed potential complications of bleeding, bruising, swelling, pain, nerve damage, sinus involvement, root fracture, mandible fracture, soft tissue injuries, anesthetic complications, respiratory distress, and cardiovascular complications up to and including  death.   [name], DDS

## 2013-05-21 NOTE — H&P (Signed)
Patient ID: Audrey Olson MRN: [id number], DOB/AGE: [birth year]   Admit date: 05/21/2013   Primary Physician: Glendon Axe, MD Primary Cardiologist: J. Allred, MD  TCTSRemus Loffler, MD  Pt. Profile:  53 y/o female with a h/o NICM, chronic syst CHF, mitral regurgitation (trivial on echo 04/2013) who presented today for multiple tooth extractions and we're asked to admit post-op.  Problem List  Past Medical History  Diagnosis Date  . Lumbar spondylosis   . Hyperlipidemia     takes Crestor daily as well as Lovaza  . Acid reflux   . Restless legs syndrome (RLS)   . Vitamin D deficiency     takes Vit D 50000 units weekly  . History of rectal bleeding   . Hepatomegaly     With splenomegaly  . Unspecified urinary incontinence   . Degeneration of lumbar or lumbosacral intervertebral disc   . Fatty pancreas   . Internal hemorrhoids   . LVH (left ventricular hypertrophy)   . Generalized anxiety disorder   . Anemia   . Hyponatremia   . Disorders of magnesium metabolism   . Alpha-1-antitrypsin deficiency     pt is not aware of this diagnosis  . Hemangioma of spleen   . Chronic combined systolic and diastolic CHF (congestive heart failure)     a. 04/2013 Echo: EF 25-30%.  . Non-ischemic cardiomyopathy     a. 04/2013 Echo: EF 25-30%, glob HK, basal and mid antsept/infersept/ant AK, Grade 4 DD, Triv AI/MR, mod dil LA.  . Mitral regurgitation     a. Prev noted to be mod-sev by TEE 02/2013;  b. 04/2013 Echo: EF 25-30%, triv MR.  . Diabetic neuropathy   . Chronic pain   . Tobacco abuse   . Severe obesity (BMI 35.0-39.9) with comorbidity   . Arthritis     Lower back and both hips with severe chronic pain and limited mobility  . Osteoporosis   . COPD (chronic obstructive pulmonary disease)     uses Albuterol daily as needed  . Panic attacks     takes Klonopin nightly  . Anxiety   . Type II diabetes mellitus     Insulin-dependent since 2008 - takes Novolog and Lantus daily as well as  Metformin  . Hypertension     takes Diovan daily  . Asthma     as a child   . History of pneumonia     hx of;last time around the 80's  . History of bronchitis     last time a couple of yrs ago  . Bell's palsy   . Headache(784.0)     d/t Bells palsy and last headache 60months ago  . Peripheral neuropathy     takes Lyrica daily  . Fibromyalgia   . Joint Swelling and Pain     knees,shoulders,elbows,and hands  . Back pain     pt states over 100 bones spurs on her left hip;scoliosis  . History of gastric ulcer   . Multiple allergies     uses Flonase daily as needed  . History of colon polyps   . History of MRSA infection 2005  . History of shingles 2005  . Obstructive sleep apnea     a. mild OSA by 08/16/11 sleep study [facility]);  b. does not tolerate CPAP.    Past Surgical History  Procedure Laterality Date  . Nasal sinus surgery    . Appendectomy    . Cholecystectomy    . Incisional hernia  repair    . Breast biopsy Left   . Cardiac catheterization  around 2007/2014    x 2  . Ganglion cyst removed Left   . Colonoscopy    . Esophagogastroduodenoscopy      Allergies  Allergies  Allergen Reactions  . Sulfa Antibiotics Anaphylaxis    Anaphylaxis with Sulfa only when combined with 800 mg Ibuprofen  . Adhesive [Tape]     Breaks out   HPI  53 y/o female with the above complex problem list.  She has a h/o NICM with an EF of 25-30% s/p catheterization in [place] in 11/2012, revealing nl cors.  An echo in 02/2013 showing depressed EF and mod-sev MR and she was referred to Dr. Rayann Heman for consideration of ICD placement.  He felt that surgical consult was warranted in the setting of mod-sev MR and she saw Dr. Roxy Manns on 2/13.  CTA of the chest/abd/pelvis was performed after this visit showing a nonocllusive partially calcified plaque throughout the infrarenal abd Ao and bilat common ililac arteries but no significant acute findings.  She was also referred to dental  surgery but failed to f/u as planned.  Echo was repeated in early March and this time showed only trivial MR.  When she was seen back by Dr. Rayann Heman, he recommended following through with dental eval given very poor dentition and then subsequent TCTS f/u with the feeling that she would not require valve surgery given lack of severity on recent echo.  She f/u with dental surgery and today presented for multiple tooth extractions.  We have been asked to observe post-operatively.  Pt currently doing well.  Reports minor mouth pain.  No chest pain or dyspnea.  She has chronic orthopnea but overall has been doing well w/o pnd, dizziness, syncope, early satiety, or significant edema.  She weighs herself daily and says that her wt has been stable.  Home Medications  Prior to Admission medications   Medication Sig Start Date End Date Taking? Authorizing Provider  albuterol (PROVENTIL HFA;VENTOLIN HFA) 108 (90 BASE) MCG/ACT inhaler Inhale 2 puffs into the lungs every 6 (six) hours as needed for wheezing or shortness of breath.   Yes Historical Provider, MD  aspirin EC 81 MG tablet Take 81 mg by mouth daily.   Yes Historical Provider, MD  carvedilol (COREG) 25 MG tablet Take 25 mg by mouth 2 (two) times daily with a meal.   Yes Historical Provider, MD  clonazePAM (KLONOPIN) 1 MG tablet Take 1 mg by mouth at bedtime as needed for anxiety.   Yes Historical Provider, MD  cyclobenzaprine (FLEXERIL) 10 MG tablet Take 10 mg by mouth 3 (three) times daily as needed for muscle spasms.   Yes Historical Provider, MD  HYDROcodone-acetaminophen (NORCO) 10-325 MG per tablet Take 1 tablet by mouth every 6 (six) hours as needed for moderate pain.    Yes Historical Provider, MD  insulin aspart (NOVOLOG FLEXPEN) 100 UNIT/ML FlexPen Inject 6-36 Units into the skin 3 (three) times daily with meals. Sliding scale   Yes Historical Provider, MD  insulin glargine (LANTUS) 100 UNIT/ML injection Inject 80 Units into the skin 2 (two) times  daily.    Yes Historical Provider, MD  Magnesium Oxide 250 MG TABS Take 1 tablet by mouth daily.   Yes Historical Provider, MD  metFORMIN (GLUCOPHAGE) 1000 MG tablet Take 1,000 mg by mouth 2 (two) times daily with a meal.   Yes Historical Provider, MD  omega-3 acid ethyl esters (LOVAZA) 1 G  capsule Take 4 g by mouth daily.    Yes Historical Provider, MD  omeprazole (PRILOSEC) 40 MG capsule Take 40 mg by mouth daily.   Yes Historical Provider, MD  pregabalin (LYRICA) 150 MG capsule Take 150 mg by mouth every evening.    Yes Historical Provider, MD  rosuvastatin (CRESTOR) 40 MG tablet Take 40 mg by mouth daily.   Yes Historical Provider, MD  valsartan (DIOVAN) 160 MG tablet Take 160 mg by mouth daily.   Yes Historical Provider, MD  Vitamin D, Ergocalciferol, (DRISDOL) 50000 UNITS CAPS capsule Take 50,000 Units by mouth every 7 (seven) days. Tuesdays   Yes Historical Provider, MD  fluticasone (FLONASE) 50 MCG/ACT nasal spray Place 2 sprays into both nostrils at bedtime as needed for allergies.     Historical Provider, MD  nitroGLYCERIN (NITROSTAT) 0.4 MG SL tablet Place 0.4 mg under the tongue every 5 (five) minutes as needed for chest pain.    Historical Provider, MD   Family History  Family History  Problem Relation Age of Onset  . Hyperlipidemia Mother   . Hypertension Mother   . Heart disease Mother   . Hyperlipidemia Father   . Diabetes Father     DM 2   . Hypertension Father   . Heart disease Father    Social History  History   Social History  . Marital Status: Divorced    Spouse Name: N/A    Number of Children: 2  . Years of Education: N/A   Occupational History  . Not on file.   Social History Main Topics  . Smoking status: Current Every Day Smoker -- 0.25 packs/day for 38 years    Types: Cigarettes    Start date: 02/13/1974  . Smokeless tobacco: Never Used     Comment: 05/2013: currently smoking 1/3 ppd.  . Alcohol Use: No     Comment: rare  . Drug Use: No  .  Sexual Activity: Not Currently    Birth Control/ Protection: Post-menopausal   Other Topics Concern  . Not on file   Social History Narrative   Lives in [place] with sister.  Unemployed     Review of Systems General:  No chills, fever, night sweats or weight changes.   HEENT: c/o tooth/gum/mouth pain. Cardiovascular:  No chest pain, dyspnea on exertion, edema, +++ chronic orthopnea, no palpitations, paroxysmal nocturnal dyspnea. Dermatological: No rash, lesions/masses Respiratory: No cough, dyspnea Urologic: No hematuria, dysuria Abdominal:   No nausea, vomiting, diarrhea, bright red blood per rectum, melena, or hematemesis Neurologic:  No visual changes, wkns, changes in mental status. All other systems reviewed and are otherwise negative except as noted above.  Physical Exam  Blood pressure 94/38, pulse 104, temperature 97.1 F (36.2 C), temperature source Oral, resp. rate 19, weight 221 lb (100.245 kg), SpO2 94.00%.  General: Pleasant, NAD Psych: Normal affect. Neuro: Alert and oriented X 3. Moves all extremities spontaneously. HEENT: Normal  Neck: Supple without bruits.  Difficult to assess JVP 2/2 girth. Lungs:  Resp regular and unlabored, diminished breath sounds bilat. Heart: RRR no s3, s4, 1/6 sem Abdomen: Soft, non-tender, non-distended, BS + x 4.  Extremities: No clubbing, cyanosis.  Trace bilat LE edema. DP/PT/Radials 2+ and equal bilaterally.  Labs  Lab Results  Component Value Date   WBC 13.6* 05/20/2013   HGB 11.8* 05/20/2013   HCT 34.7* 05/20/2013   MCV 93.0 05/20/2013   PLT 253 05/20/2013     Recent Labs Lab 05/20/13 0925  NA  136*  K 4.8  CL 95*  CO2 24  BUN 37*  CREATININE 1.31*  CALCIUM 9.5  PROT 6.8  BILITOT 0.2*  ALKPHOS 123*  ALT 14  AST 9  GLUCOSE 406*   Radiology/Studies  No results found.  ECG  None  ASSESSMENT AND PLAN  1. S/P Tooth extractions:  Per dental surgery.  2.  Mitral Regurgitation:  Trivial by last echo in  March.  This will need to be reviewed as an outpt and decision made re: necessity of any further w/u given discrepancy in severity (TEE 02/2013 Mod-sev, TTE 04/2013 Triv).   3.  NICM/Chronic syst CHF:  Cont BB/ARB.  Outpt EP f/u for ICD placement once it is clear what plans are from MR standpoint.  4.  HTN:  Stable.  Cont bb/arb.  5.  OSA:  Does not tolerate CPAP.  6.  Anxiety:  Cont home meds.  7.  Chronic pain: Cont home meds.  8.  Tob Abuse:  Complete cessation advised.  Signed, Rogelia Mire, NP 05/21/2013, 12:53 PM  Patient seen and examined. Agree with assessment and plan. Pt is now s/p dental extraction. She is in sinus rhythm but rate in 90 - 100 range many be due to not taking coreg this am. No chest pain; no dyspnea. ECG stable. No ectopy. 1/6 systolic murmur. Will keep overnight as planned for observation and plan dc tomorrow with f/u with Dr. Rayann Heman and Roxy Manns. Consider a re-trial of CPAP therapy as outpatient with continued improvement in mask styles and technology.   Troy Sine, MD, [facility] 05/21/2013 1:05 PM

## 2013-05-21 NOTE — Significant Event (Addendum)
Rapid Response Event Note  Overview: Time Called: 2204 Arrival Time: 2210 Event Type: Other (Comment) (Fever)  Initial Focused Assessment: Called by bedside RN regarding a drop in O2 sats (patient had taken O2 off, but not returning to baseline) and increased lethargy. Upon arrival to room vital signs being obtained, rectal temp 104.1, sats 92% on 6L [place], patient oriented to self and lethargic, HR 110, BP 126/77. Patient had dental surgery earlier in the day. Lung sounds clear  Interventions: Admitting MD paged, ice packets placed near patient's core. Orders received for labs, chest xray, urine culture, zosyn, vancomycin and tylenol. Will continue to monitor, advised bedside RN to call with further needs  Event Summary: Name of Physician Notified: Cards, MD at 2220    at    Outcome: Stayed in room and stabalized  Event End Time: [place]

## 2013-05-21 NOTE — Progress Notes (Signed)
ANTIBIOTIC CONSULT NOTE - INITIAL  Pharmacy Consult for vancomycin and zosyn  Indication: sepsis  Allergies  Allergen Reactions  . Sulfa Antibiotics Anaphylaxis    Anaphylaxis with Sulfa only when combined with 800 mg Ibuprofen  . Adhesive [Tape]     Breaks out    Patient Measurements: Height: 5\' 2"  (157.5 cm) Weight: 222 lb (100.699 kg) IBW/kg (Calculated) : 50.1 Adjusted Body Weight:   Vital Signs: Temp: 103 F (39.4 C) (04/08 2224) Temp src: Oral (04/08 2224) BP: 126/77 mmHg (04/08 2139) Pulse Rate: 111 (04/08 2224) Intake/Output from previous day:   Intake/Output from this shift: Total I/O In: -  Out: 200 [Urine:200]  Labs:  Recent Labs  05/20/13 0925  WBC 13.6*  HGB 11.8*  PLT 253  CREATININE 1.31*   Estimated Creatinine Clearance: 56.4 ml/min (by C-G formula based on Cr of 1.31). No results found for this basename: VANCOTROUGH, VANCOPEAK, VANCORANDOM, GENTTROUGH, GENTPEAK, GENTRANDOM, TOBRATROUGH, TOBRAPEAK, TOBRARND, AMIKACINPEAK, AMIKACINTROU, AMIKACIN,  in the last 72 hours   Microbiology: No results found for this or any previous visit (from the past 720 hour(s)).  Medical History: Past Medical History  Diagnosis Date  . Lumbar spondylosis   . Hyperlipidemia     takes Crestor daily as well as Lovaza  . Acid reflux   . Restless legs syndrome (RLS)   . Vitamin D deficiency     takes Vit D 50000 units weekly  . History of rectal bleeding   . Hepatomegaly     With splenomegaly  . Unspecified urinary incontinence   . Degeneration of lumbar or lumbosacral intervertebral disc   . Fatty pancreas   . Internal hemorrhoids   . LVH (left ventricular hypertrophy)   . Generalized anxiety disorder   . Anemia   . Hyponatremia   . Disorders of magnesium metabolism   . Alpha-1-antitrypsin deficiency     pt is not aware of this diagnosis  . Hemangioma of spleen   . Chronic combined systolic and diastolic CHF (congestive heart failure)     a. 04/2013  Echo: EF 25-30%.  . Non-ischemic cardiomyopathy     a. 04/2013 Echo: EF 25-30%, glob HK, basal and mid antsept/infersept/ant AK, Grade 4 DD, Triv AI/MR, mod dil LA.  . Mitral regurgitation     a. Prev noted to be mod-sev by TEE 02/2013;  b. 04/2013 Echo: EF 25-30%, triv MR.  . Diabetic neuropathy   . Chronic pain   . Tobacco abuse   . Severe obesity (BMI 35.0-39.9) with comorbidity   . Arthritis     Lower back and both hips with severe chronic pain and limited mobility  . Osteoporosis   . COPD (chronic obstructive pulmonary disease)     uses Albuterol daily as needed  . Panic attacks     takes Klonopin nightly  . Anxiety   . Type II diabetes mellitus     Insulin-dependent since 2008 - takes Novolog and Lantus daily as well as Metformin  . Hypertension     takes Diovan daily  . Asthma     as a child   . History of pneumonia     hx of;last time around the 80's  . History of bronchitis     last time a couple of yrs ago  . Bell's palsy   . Headache(784.0)     d/t Bells palsy and last headache 21months ago  . Peripheral neuropathy     takes Lyrica daily  . Fibromyalgia   .  Joint Swelling and Pain     knees,shoulders,elbows,and hands  . Back pain     pt states over 100 bones spurs on her left hip;scoliosis  . History of gastric ulcer   . Multiple allergies     uses Flonase daily as needed  . History of colon polyps   . History of MRSA infection 2005  . History of shingles 2005  . Obstructive sleep apnea     a. mild OSA by 08/16/11 sleep study [facility]);  b. does not tolerate CPAP.  Marland Kitchen Mitral valve disease     Medications:  Prescriptions prior to admission  Medication Sig Dispense Refill  . albuterol (PROVENTIL HFA;VENTOLIN HFA) 108 (90 BASE) MCG/ACT inhaler Inhale 2 puffs into the lungs every 6 (six) hours as needed for wheezing or shortness of breath.      Marland Kitchen aspirin EC 81 MG tablet Take 81 mg by mouth daily.      . carvedilol (COREG) 25 MG tablet Take 25 mg by  mouth 2 (two) times daily with a meal.      . clonazePAM (KLONOPIN) 1 MG tablet Take 1 mg by mouth at bedtime as needed for anxiety.      . cyclobenzaprine (FLEXERIL) 10 MG tablet Take 10 mg by mouth 3 (three) times daily as needed for muscle spasms.      Marland Kitchen HYDROcodone-acetaminophen (NORCO) 10-325 MG per tablet Take 1 tablet by mouth every 6 (six) hours as needed for moderate pain.       Marland Kitchen insulin aspart (NOVOLOG FLEXPEN) 100 UNIT/ML FlexPen Inject 6-36 Units into the skin 3 (three) times daily with meals. Sliding scale      . insulin glargine (LANTUS) 100 UNIT/ML injection Inject 80 Units into the skin 2 (two) times daily.       . Magnesium Oxide 250 MG TABS Take 1 tablet by mouth daily.      . metFORMIN (GLUCOPHAGE) 1000 MG tablet Take 1,000 mg by mouth 2 (two) times daily with a meal.      . omega-3 acid ethyl esters (LOVAZA) 1 G capsule Take 4 g by mouth daily.       Marland Kitchen omeprazole (PRILOSEC) 40 MG capsule Take 40 mg by mouth daily.      . pregabalin (LYRICA) 150 MG capsule Take 150 mg by mouth every evening.       . rosuvastatin (CRESTOR) 40 MG tablet Take 40 mg by mouth daily.      . valsartan (DIOVAN) 160 MG tablet Take 160 mg by mouth daily.      . Vitamin D, Ergocalciferol, (DRISDOL) 50000 UNITS CAPS capsule Take 50,000 Units by mouth every 7 (seven) days. Tuesdays      . fluticasone (FLONASE) 50 MCG/ACT nasal spray Place 2 sprays into both nostrils at bedtime as needed for allergies.       . nitroGLYCERIN (NITROSTAT) 0.4 MG SL tablet Place 0.4 mg under the tongue every 5 (five) minutes as needed for chest pain.       Assessment: 53 y/o female with a h/o NICM, chronic syst CHF, mitral regurgitation (trivial on echo 04/2013) who presented today for multiple tooth extractions    RR called for decreased O2 sats and rectal temp 92% on 6L with HR110  vanc and zosyn for empiric broad cvg s/p dental surgery    Goal of Therapy:  Vancomycin trough level 15-20 mcg/ml  Plan:  Zosyn 3.375 q8h  and 2 gm vanc load followed by 1500 mg  q24h  F/u trough before 4th dose.    Curlene Dolphin 05/21/2013,11:10 PM

## 2013-05-21 NOTE — Transfer of Care (Signed)
Immediate Anesthesia Transfer of Care Note  Patient: Audrey Olson  Procedure(s) Performed: Procedure(s): Extraction of tooth #'s 12,19, and 30 with alveoloplasty and gross debridement of remaining teeth (N/A)  Patient Location: PACU  Anesthesia Type:General  Level of Consciousness: lethargic and responds to stimulation  Airway & Oxygen Therapy: Patient Spontanous Breathing and Patient connected to face mask oxygen  Post-op Assessment: Report given to PACU RN and Post -op Vital signs reviewed and stable  Post vital signs: Reviewed and stable  Complications: No apparent anesthesia complications

## 2013-05-21 NOTE — Progress Notes (Signed)
Patient trasfered from [place] to 5W10 via stretcher; alert and oriented x 4; no complaints of pain; IV  in LW  running  Lactated@20cc /hr ; skin intact; ice colar in place per MD order. Orient patient to room and unit; watch safety video; gave patient care guide; instructed how to use the call bell and  fall risk precautions. Will continue to monitor the patient.

## 2013-05-21 NOTE — Anesthesia Preprocedure Evaluation (Addendum)
Anesthesia Evaluation  Patient identified by MRN, date of birth, ID band Patient awake    Reviewed: Allergy & Precautions, H&P , NPO status , Patient's Chart, lab work & pertinent test results  Airway       Dental   Pulmonary asthma , sleep apnea , COPDCurrent Smoker,          Cardiovascular hypertension, +CHF Rhythm:Regular Rate:Normal  ECHO poor LV function reduced EF   Neuro/Psych  Headaches,  Neuromuscular disease    GI/Hepatic GERD-  ,  Endo/Other  diabetes, Poorly ControlledMorbid obesity  Renal/GU CRFRenal disease     Musculoskeletal  (+) Fibromyalgia -  Abdominal   Peds  Hematology  (+) anemia ,   Anesthesia Other Findings   Reproductive/Obstetrics                          Anesthesia Physical Anesthesia Plan  ASA: IV  Anesthesia Plan: General   Post-op Pain Management:    Induction: Intravenous  Airway Management Planned: Nasal ETT  Additional Equipment:   Intra-op Plan:   Post-operative Plan: Extubation in OR  Informed Consent: I have reviewed the patients History and Physical, chart, labs and discussed the procedure including the risks, benefits and alternatives for the proposed anesthesia with the patient or authorized representative who has indicated his/her understanding and acceptance.   Dental advisory given  Plan Discussed with:   Anesthesia Plan Comments:         Anesthesia Quick Evaluation

## 2013-05-21 NOTE — H&P (Signed)
05/21/2013  Patient:            Audrey Olson Date of Birth:  02/06/1961 MRN:                [id number]  BP 160/65  Pulse 116  Temp(Src) 99.2 F (37.3 C) (Oral)  Resp 18  Wt 221 lb (100.245 kg)  SpO2 94%  Audrey Olson presents for multiple extractions with alveoloplasty and gross debridement of teeth in the OR. Patient denies acute medical or dental changes. See Dr. Jackalyn Olson note to use as H and P for dental OR procedures.   Audrey Olson, DDS      Primary Care Physician: Audrey Axe, MD Referring Physician:  Dr Audrey Olson   Audrey Olson is a 53 y.o. female with a h/o a nonischemic cardiomyopathy, NYHA Class III CHF, and mitral regurgitation who presents today for EP follow-up.  When I saw her last, I referred her to Dr Audrey Olson for evaluation of mitral regurgitation.  Interestingly 2D echo 04/15/13 revealed only trivial MR with EF 25%.  She has been referred to a dental surgeon due to poor dentition but has not been compliant with this yet.  She remains limited by leg weakness which occurs after walking 100 feet.  She has 3 pillow orthopnea.  She continues to smoke and is not ready to quit. Today, she denies symptoms of palpitations, chest pain, lower extremity edema, dizziness, presyncope, syncope, or neurologic sequela. The patient is tolerating medications without difficulties and is otherwise without complaint today.     Past Medical History   Diagnosis  Date   .  Lumbar spondylosis     .  Hypertrophy of breast     .  CKD (chronic kidney disease) stage 3, GFR 30-59 ml/min     .  Hypertension     .  Hyperlipidemia     .  Acid reflux     .  Obesity     .  Restless legs syndrome (RLS)     .  Tobacco use disorder     .  Vitamin D deficiency     .  Diabetes mellitus type 2, uncontrolled     .  History of noncompliance with medical treatment, presenting hazards to health     .  History of rectal bleeding     .  Proteinuria     .  Hepatomegaly         With  splenomegaly   .  Unspecified urinary incontinence     .  Fatigue     .  Unspecified sleep apnea         mild per pt   .  Degeneration of lumbar or lumbosacral intervertebral disc     .  Fatty pancreas     .  Fatty liver     .  Internal hemorrhoids     .  LVH (left ventricular hypertrophy)     .  CHF (congestive heart failure)     .  Cardiomyopathy     .  Benign essential tremor     .  Generalized anxiety disorder     .  Anemia     .  Hyponatremia     .  Disorders of magnesium metabolism     .  Alpha-1-antitrypsin deficiency         listed, she is not aware of this diagnosis   .  Colon polyp     .  Hemangioma of spleen     .  Chronic combined systolic and diastolic CHF (congestive heart failure)     .  Non-ischemic cardiomyopathy     .  Mitral regurgitation     .  Type II diabetes mellitus         Insulin-dependent since 2008   .  Diabetic neuropathy     .  Chronic pain     .  Panic attacks     .  Anxiety     .  Chronic kidney disease     .  Diabetic nephropathy     .  Tobacco abuse     .  COPD (chronic obstructive pulmonary disease)     .  Obstructive sleep apnea         Doesn't tolerate CPAP   .  Severe obesity (BMI 35.0-39.9) with comorbidity     .  Arthritis         Lower back and both hips with severe chronic pain and limited mobility   .  Hypercholesterolemia     .  Osteoporosis         Past Surgical History   Procedure  Laterality  Date   .  Breast biopsy       .  Nasal sinus surgery       .  Appendectomy       .  Cholecystectomy       .  Incisional hernia repair             Current Outpatient Prescriptions   Medication  Sig  Dispense  Refill   .  albuterol (PROVENTIL HFA;VENTOLIN HFA) 108 (90 BASE) MCG/ACT inhaler  Inhale 2 puffs into the lungs every 6 (six) hours as needed for wheezing or shortness of breath.         .  carvedilol (COREG) 25 MG tablet  Take 25 mg by mouth 2 (two) times daily with a meal.         .  clonazePAM (KLONOPIN) 1 MG tablet   Take 1 mg by mouth at bedtime as needed for anxiety.         .  cyclobenzaprine (FLEXERIL) 10 MG tablet  Take 10 mg by mouth 3 (three) times daily as needed for muscle spasms.         .  fluticasone (FLONASE) 50 MCG/ACT nasal spray  Place 2 sprays into both nostrils at bedtime.         Marland Kitchen  HYDROcodone-acetaminophen (NORCO) 10-325 MG per tablet  Take 1 tablet by mouth every 6 (six) hours as needed.         .  insulin aspart (NOVOLOG FLEXPEN) 100 UNIT/ML FlexPen  Sliding scale         .  insulin glargine (LANTUS) 100 UNIT/ML injection  Inject 80 Units into the skin 3 (three) times daily.         .  Magnesium Oxide 250 MG TABS  Take 1 tablet by mouth daily.         .  metFORMIN (GLUCOPHAGE) 1000 MG tablet  Take 1,000 mg by mouth 2 (two) times daily with a meal.         .  nitroGLYCERIN (NITROSTAT) 0.4 MG SL tablet  Place 0.4 mg under the tongue every 5 (five) minutes as needed for chest pain.         Marland Kitchen  omega-3 acid ethyl esters (LOVAZA) 1 G capsule  Take 1 g by mouth daily.         Marland Kitchen  omeprazole (PRILOSEC) 40 MG capsule  Take 40 mg by mouth daily.         .  pregabalin (LYRICA) 150 MG capsule  Take 150 mg by mouth every evening.          .  rosuvastatin (CRESTOR) 40 MG tablet  Take 40 mg by mouth daily.         .  valsartan (DIOVAN) 160 MG tablet  Take 160 mg by mouth daily.         .  Vitamin D, Ergocalciferol, (DRISDOL) 50000 UNITS CAPS capsule  Take 50,000 Units by mouth every 7 (seven) days.             No current facility-administered medications for this visit.         Allergies   Allergen  Reactions   .  Sulfa Antibiotics  Anaphylaxis       Anaphylaxis with Sulfa only when combined with 800 mg Ibuprofen         History       Social History   .  Marital Status:  Divorced       Spouse Name:  N/A       Number of Children:  N/A   .  Years of Education:  N/A       Occupational History   .  Not on file.       Social History Main Topics   .  Smoking status:  Current Every  Day Smoker -- 0.33 packs/day       Types:  Cigarettes       Start date:  02/13/1974   .  Smokeless tobacco:  Not on file   .  Alcohol Use:  No   .  Drug Use:  No   .  Sexual Activity:  Not on file       Other Topics  Concern   .  Not on file       Social History Narrative     Lives in [place] with sister.  Unemployed         Family History   Problem  Relation  Age of Onset   .  Hyperlipidemia  Mother     .  Hypertension  Mother     .  Heart disease  Mother     .  Hyperlipidemia  Father     .  Diabetes  Father         DM 2    .  Hypertension  Father     .  Heart disease  Father          ROS- All systems are reviewed and negative except as per the HPI above   Physical Exam: Filed Vitals:     04/21/13 1627   BP:  138/64   Pulse:  100   Height:  5\' 5"  (1.651 m)   Weight:  222 lb (100.699 kg)        GEN- The patient is overweight appearing, alert and oriented x 3 today.    Head- normocephalic, atraumatic Eyes-  Sclera clear, conjunctiva pink Ears- hearing intact Oropharynx- clear Neck- supple, no JVP Lymph- no cervical lymphadenopathy Lungs- Clear to ausculation bilaterally, normal work of breathing Heart- Regular rate and rhythm   GI- soft, NT, ND, + BS Extremities- no clubbing, cyanosis, or edema MS- no significant deformity or atrophy Skin- no rash or lesion Psych- euthymic mood, full affect Neuro- strength and sensation are intact  Echo 3/15 reviewed (as above)   myoview 08/28/12- EF 19%, no ischemia Dr Guy Sandifer notes are also reviewed ekg 03/20/13 reveals sinus rhythm 87 bpm, PR 166, QRS 96, QTc 476, septal infarct, otherwise normal ekg   Assessment and Plan:     1. MR Recent echo suggests that her MR is not likely to be surgical.  She has scheduled follow-up with Dr Audrey Olson.     2. Nonischemic CM (EF 30%), NYHA Class III CHF   If she does not require valvular repair (seems unlikely given recent echo), then we will proceed with ICD  implantation after she has had the appropriate dental extraction performed.  I have encouraged her to follow-up with dental surgery as has been recommended by Dr Audrey Olson.   2. HTN Stable No change required today   3. OSA Compliance with weight loss is encouraged   She is instructed to contact our office after she has had dental care.  We will likely proceed with ICD implantation at that time.

## 2013-05-21 NOTE — Anesthesia Procedure Notes (Signed)
Procedure Name: Intubation Date/Time: 05/21/2013 8:41 AM Performed by: [place] Pink Pre-anesthesia Checklist: Patient identified, Timeout performed, Emergency Drugs available, Suction available and Patient being monitored Patient Re-evaluated:Patient Re-evaluated prior to inductionOxygen Delivery Method: Circle system utilized Preoxygenation: Pre-oxygenation with 100% oxygen Intubation Type: IV induction Ventilation: Mask ventilation without difficulty and Oral airway inserted - appropriate to patient size Laryngoscope Size: Mac and 3 Grade View: Grade III Tube type: Oral Tube size: 7.5 mm Number of attempts: 2 Airway Equipment and Method: Video-laryngoscopy and Rigid stylet Placement Confirmation: ETT inserted through vocal cords under direct vision,  positive ETCO2 and breath sounds checked- equal and bilateral Secured at: 21 cm Tube secured with: Tape Dental Injury: Teeth and Oropharynx as per pre-operative assessment  Comments: DLx 1 by A. Nelda Marseille, CRNA Grade III view esophageal intubation, Glidescope utilized for DLx2 successful endotracheal tube placement

## 2013-05-21 NOTE — Op Note (Signed)
Patient:            Audrey Olson Date of Birth:  01-17-1961 MRN:                [id number]   DATE OF PROCEDURE:  05/21/2013               OPERATIVE REPORT   PREOPERATIVE DIAGNOSES: 1.   mitral regurgitation 2.   pre-heart valve surgery dental protocol 3.   chronic apical periodontitis 4.   dental caries 5.   retained root segments 6.   accretions  POSTOPERATIVE DIAGNOSES: 1.   mitral regurgitation 2.   pre-heart valve surgery dental protocol 3.   chronic apical periodontitis 4.   dental caries 5.   retained root segments 6.   accretions   OPERATIONS: 1. Multiple extraction of tooth numbers 12, 19, and 30 2. 3 Quadrants of alveoloplasty 3. Gross debridement of remaining dentition   SURGEON: [name], DDS  ASSISTANT: Camie Patience, (dental assistant)  ANESTHESIA: General anesthesia via oral endotracheal tube.  MEDICATIONS: 1. Ancef 2 g IV prior to invasive dental procedures. 2. Local anesthesia with a total utilization of 1 carpules each containing 34 mg of lidocaine with 0.017 mg of epinephrine as well as 3 carpules each containing 9 mg of bupivacaine with 0.009 mg of epinephrine.  SPECIMENS: There were 3 teeth that were discarded.  DRAINS: None  CULTURES: None  COMPLICATIONS: None   ESTIMATED BLOOD LOSS: Less than 50 mLs.  INTRAVENOUS FLUIDS: 1200 mLs of Lactated ringers solution.  INDICATIONS: The patient was recently diagnosed with mitral regurgitation.  A medically necessary dental consultation was then requested to evaluate poor dentition prior to anticipated heart valve surgery.  The patient was examined and treatment planned for multiple extractions with alveoloplasty and gross debridement of remaining  dentition.  This treatment plan was formulated to decrease the risks and complications associated with dental infection from affecting the patient's systemic health and the anticipated heart valve surgery.  OPERATIVE FINDINGS: Patient was  examined operating room number 7.  The teeth were identified for extraction. The patient was noted be affected by chronic periodontitis, accretions, chronic apical periodontitis, multiple retained root segments, and dental caries.   DESCRIPTION OF PROCEDURE: Patient was brought to the main operating room number 7. Patient was then placed in the supine position on the operating table. General anesthesia was then induced per the anesthesia team. The patient was then prepped and draped in the usual manner for dental medicine procedure. A timeout was performed. The patient was identified and procedures were verified. A throat pack was placed at this time. The oral cavity was then thoroughly examined with the findings noted above. The patient was then ready for dental medicine procedure as follows:  Local anesthesia was then administered sequentially with a total utilization of 1 carpules each containing 34 mg of lidocaine with 0.017 mg of epinephrine as well as 3 carpules  each containing 9 mg bupivacaine with 0.009 mg of epinephrine.  The Maxillary left and right quadrants were first approached. Anesthesia was then delivered utilizing infiltration with lidocaine with epinephrine. A #15 blade incision was then made from the mesial of #11 and extended to the mesial of #13.  A  surgical flap was then carefully reflected. Appropriate amounts of buccal and interseptal bone around tooth #12 was then removed utilizing a surgical handpiece and bur and copious amounts of sterile water.  Tooth #12 was then subluxated with a series of straight elevators. The  retained root #12 was then removed with a 150 forceps without complications. Alveoloplasty was then performed utilizing a ronguers and bone file. The surgical site was then irrigated with copious amounts of sterile saline. A piece of Surgifoam was placed the extraction socket. The tissues were approximated and trimmed appropriately. The surgical site was then closed  from the mesial #13 and extended to the distal of #11 utilizing 3-0 chromic gut suture in a continuous interrupted suture technique x1 .  At this point time, the mandibular quadrants were approached. The patient was given bilateral inferior alveolar nerve blocks and long buccal nerve blocks utilizing the bupivacaine with epinephrine. Further infiltration was then achieved utilizing the lidocaine with epinephrine. A 15 blade incision was then made from the distal of number 18 and extended to the mesial of #20.  A surgical flap was then carefully reflected. Appropriate amounts of buccal and interseptal bone were then removed around tooth #19 utilizing a surgical handpiece and copious amount of sterile water. Tooth #19 was then subluxated with a series of straight elevators. The coronal aspect of tooth #19 was then removed with a 23 forceps leaving roots remaining.  Further bone was then removed around retained roots and the roots were then elevated out with a series of cryers elevators without further complications. Alveoloplasty was then performed utilizing a rongeurs and bone file. The tissues were approximated and trimmed appropriately. The surgical sites were then irrigated with copious amounts of sterile saline. A piece of Surgifoam was placed in the extraction socket appropriately. The mandibular left surgical site was then closed from the distal of 18 and extended to the distal of #20 utilizing 3-0 chromic gut suture in a continuous interrupted suture technique x1. An interproximal suture was placed between tooth numbers 20 and 21 utilizing 3-0 chromic gut material x1.  The mandibular right surgical site was then approached. A 15 blade incision was made from the distal of #31 and extended to the mesial of #29. A surgical flap was then carefully reflected. Appropriate amounts of bone were then removed around retained roots #30. Retained roots were then elevated out with a series of cryers elevators without  complication. Alveoloplasty was then performed utilizing a rongeur and bone file. The surgical site was then irrigated with copious amounts of sterile saline. The tissues were approximated and trimmed appropriately. Surgifoam was then placed in the extraction socket. The surgical site was then closed from the distal of #31 and extended to the distal of #29 utilizing 3-0 chromic gut suture in a continuous interrupted suture technique x1.   At this point time a gross debridement procedure was performed with a sonic scaler. A series of hand curettes were utilized refine removal of accretions. A sonic scaler was then again used to further refine removal of accretions. The entire mouth was irrigated with copious amounts sterile saline. The patient was examinted for complications, seeing none, dental medicine procedures deemed to be complete.  The throat pack was removed at this time. The patient was then handed over to the anesthesia team for final disposition. After an appropriate amount of time, the patient was extubated and taken to the postanesthsia care unit with stable vital signs and a good condition. All counts were correct for the dental medicine procedure. The patient will be seen for overnight observation due to history of sleep apnea. Patient will be admitted by cardiology. Patient is acceptable for discharge tomorrow from that dental standpoint pending any complications. Patient will be seen in approximately 7-10  days by dental medicine for evaluation for suture removal.   [name], DDS.

## 2013-05-21 NOTE — Anesthesia Postprocedure Evaluation (Signed)
  Anesthesia Post-op Note  Patient: Audrey Olson  Procedure(s) Performed: Procedure(s): Extraction of tooth #'s 12,19, and 30 with alveoloplasty and gross debridement of remaining teeth (N/A)  Patient Location: PACU  Anesthesia Type:General  Level of Consciousness: awake and alert   Airway and Oxygen Therapy: Patient Spontanous Breathing  Post-op Pain: mild  Post-op Assessment: Post-op Vital signs reviewed and Patient's Cardiovascular Status Stable  Post-op Vital Signs: stable  Last Vitals:  Filed Vitals:   05/21/13 1130  BP:   Pulse: 103  Temp:   Resp: 20    Complications: No apparent anesthesia complications

## 2013-05-22 ENCOUNTER — Telehealth: Payer: Self-pay | Admitting: *Deleted

## 2013-05-22 ENCOUNTER — Encounter (HOSPITAL_COMMUNITY): Payer: Self-pay | Admitting: Dentistry

## 2013-05-22 DIAGNOSIS — F17200 Nicotine dependence, unspecified, uncomplicated: Secondary | ICD-10-CM

## 2013-05-22 DIAGNOSIS — R5082 Postprocedural fever: Secondary | ICD-10-CM

## 2013-05-22 DIAGNOSIS — J449 Chronic obstructive pulmonary disease, unspecified: Secondary | ICD-10-CM

## 2013-05-22 DIAGNOSIS — N289 Disorder of kidney and ureter, unspecified: Secondary | ICD-10-CM

## 2013-05-22 DIAGNOSIS — K08109 Complete loss of teeth, unspecified cause, unspecified class: Secondary | ICD-10-CM

## 2013-05-22 DIAGNOSIS — E119 Type 2 diabetes mellitus without complications: Secondary | ICD-10-CM

## 2013-05-22 DIAGNOSIS — I10 Essential (primary) hypertension: Secondary | ICD-10-CM

## 2013-05-22 DIAGNOSIS — K08409 Partial loss of teeth, unspecified cause, unspecified class: Secondary | ICD-10-CM

## 2013-05-22 DIAGNOSIS — K047 Periapical abscess without sinus: Secondary | ICD-10-CM

## 2013-05-22 DIAGNOSIS — K029 Dental caries, unspecified: Secondary | ICD-10-CM

## 2013-05-22 DIAGNOSIS — L03319 Cellulitis of trunk, unspecified: Secondary | ICD-10-CM

## 2013-05-22 DIAGNOSIS — R509 Fever, unspecified: Secondary | ICD-10-CM

## 2013-05-22 DIAGNOSIS — E785 Hyperlipidemia, unspecified: Secondary | ICD-10-CM

## 2013-05-22 DIAGNOSIS — L02219 Cutaneous abscess of trunk, unspecified: Secondary | ICD-10-CM

## 2013-05-22 DIAGNOSIS — J4489 Other specified chronic obstructive pulmonary disease: Secondary | ICD-10-CM

## 2013-05-22 LAB — URINALYSIS, ROUTINE W REFLEX MICROSCOPIC
Bilirubin Urine: NEGATIVE
GLUCOSE, UA: NEGATIVE mg/dL
KETONES UR: NEGATIVE mg/dL
Nitrite: NEGATIVE
PH: 5 (ref 5.0–8.0)
Protein, ur: >300 mg/dL — AB
Specific Gravity, Urine: 1.02 (ref 1.005–1.030)
Urobilinogen, UA: 0.2 mg/dL (ref 0.0–1.0)

## 2013-05-22 LAB — URINE MICROSCOPIC-ADD ON

## 2013-05-22 LAB — BASIC METABOLIC PANEL
BUN: 39 mg/dL — AB (ref 6–23)
CO2: 24 mEq/L (ref 19–32)
Calcium: 8.7 mg/dL (ref 8.4–10.5)
Chloride: 97 mEq/L (ref 96–112)
Creatinine, Ser: 2.01 mg/dL — ABNORMAL HIGH (ref 0.50–1.10)
GFR calc Af Amer: 32 mL/min — ABNORMAL LOW (ref >90)
GFR, EST NON AFRICAN AMERICAN: 28 mL/min — AB (ref >90)
GLUCOSE: 228 mg/dL — AB (ref 70–99)
Potassium: 5.3 mEq/L (ref 3.7–5.3)
SODIUM: 135 meq/L — AB (ref 137–147)

## 2013-05-22 LAB — GLUCOSE, CAPILLARY
GLUCOSE-CAPILLARY: 248 mg/dL — AB (ref 70–99)
GLUCOSE-CAPILLARY: 144 mg/dL — AB (ref 70–99)
Glucose-Capillary: 165 mg/dL — ABNORMAL HIGH (ref 70–99)
Glucose-Capillary: 202 mg/dL — ABNORMAL HIGH (ref 70–99)

## 2013-05-22 LAB — CBC WITH DIFFERENTIAL/PLATELET
BASOS ABS: 0 10*3/uL (ref 0.0–0.1)
Basophils Relative: 0 % (ref 0–1)
EOS ABS: 0.1 10*3/uL (ref 0.0–0.7)
Eosinophils Relative: 1 % (ref 0–5)
HCT: 32.5 % — ABNORMAL LOW (ref 36.0–46.0)
Hemoglobin: 10.6 g/dL — ABNORMAL LOW (ref 12.0–15.0)
LYMPHS ABS: 2.1 10*3/uL (ref 0.7–4.0)
Lymphocytes Relative: 17 % (ref 12–46)
MCH: 30.9 pg (ref 26.0–34.0)
MCHC: 32.6 g/dL (ref 30.0–36.0)
MCV: 94.8 fL (ref 78.0–100.0)
Monocytes Absolute: 0.5 10*3/uL (ref 0.1–1.0)
Monocytes Relative: 4 % (ref 3–12)
NEUTROS PCT: 78 % — AB (ref 43–77)
Neutro Abs: 9.7 10*3/uL — ABNORMAL HIGH (ref 1.7–7.7)
PLATELETS: 253 10*3/uL (ref 150–400)
RBC: 3.43 MIL/uL — ABNORMAL LOW (ref 3.87–5.11)
RDW: 14.9 % (ref 11.5–15.5)
WBC: 12.4 10*3/uL — AB (ref 4.0–10.5)

## 2013-05-22 NOTE — Procedures (Signed)
Incision and Drainage Procedure Note  Pre-operative Diagnosis: left groin abscess  Post-operative Diagnosis: same  Indications: abscess, fever, leukocytosis   Anesthesia: {local anesthetics: Lidocaine 2% with epinephrine(10cc)  Procedure Details  The procedure, risks and complications have been discussed in detail (including, but not limited to airway compromise, infection, bleeding) with the patient, and the patient has signed consent to the procedure.  The skin was sterilely prepped and draped over the affected area in the usual fashion. After adequate local anesthesia, I&D with a #11 blade was performed on the left groin.  An area of 3x4x3cm was opened. There did not appear to be any further loculated areas. Purulent drainage: moderate purulent drainage was present and a culture was obtained.  The patient has minimal amount of oozing, hemostasis was achieved with manual pressure.  The wound was then packed with 1/4 iodoform. The patient was observed until stable.  Findings: Moderate amount of purulent drainage, culture was obtained.   EBL: <15 cc's  Drains: none  Condition: Tolerated procedure well and Stable   Complications:  none.

## 2013-05-22 NOTE — Telephone Encounter (Signed)
Benjamine Mola called and left a message with the answering service that Ms. Halilovic had a temp of 104.1 and was disoriented. I assume they wanted Dr. Claiborne Billings to know.

## 2013-05-22 NOTE — Consult Note (Signed)
Reason for Consult: left groin abscess Referring Physician: Dr. Shelva Majestic   HPI: Audrey Olson is a 53 year old female with a history of NICM, MVR, chronic sCHF, diabetes mellitus, COPD, tobacco use, OSA who was admitted following multiple extraction of teeth pre-heart valve surgery.  She was kept overnight for observation.  She was noted to have leukocytosis, fever of 103.  Upon assessment, she was found to have a left groin abscess.  The patient states this has been present for about 3 weeks now.  She denies fevers pre hospitalization.  She noticed it was "red" and "sore."  It has not increased in size.  No modifying factors.  No aggravating or alleviating factors.  Mild in severity.  She has been started on Vanc and Zosyn per ID.  We have been asked to evaluate the patient for surgical evaluation.    Past Medical History  Diagnosis Date  . Lumbar spondylosis   . Hyperlipidemia     takes Crestor daily as well as Lovaza  . Acid reflux   . Restless legs syndrome (RLS)   . Vitamin D deficiency     takes Vit D 50000 units weekly  . History of rectal bleeding   . Hepatomegaly     With splenomegaly  . Unspecified urinary incontinence   . Degeneration of lumbar or lumbosacral intervertebral disc   . Fatty pancreas   . Internal hemorrhoids   . LVH (left ventricular hypertrophy)   . Generalized anxiety disorder   . Anemia   . Hyponatremia   . Disorders of magnesium metabolism   . Alpha-1-antitrypsin deficiency     pt is not aware of this diagnosis  . Hemangioma of spleen   . Chronic combined systolic and diastolic CHF (congestive heart failure)     a. 04/2013 Echo: EF 25-30%.  . Non-ischemic cardiomyopathy     a. 04/2013 Echo: EF 25-30%, glob HK, basal and mid antsept/infersept/ant AK, Grade 4 DD, Triv AI/MR, mod dil LA.  . Mitral regurgitation     a. Prev noted to be mod-sev by TEE 02/2013;  b. 04/2013 Echo: EF 25-30%, triv MR.  . Diabetic neuropathy   . Chronic pain   . Tobacco abuse    . Severe obesity (BMI 35.0-39.9) with comorbidity   . Arthritis     Lower back and both hips with severe chronic pain and limited mobility  . Osteoporosis   . COPD (chronic obstructive pulmonary disease)     uses Albuterol daily as needed  . Panic attacks     takes Klonopin nightly  . Anxiety   . Type II diabetes mellitus     Insulin-dependent since 2008 - takes Novolog and Lantus daily as well as Metformin  . Hypertension     takes Diovan daily  . Asthma     as a child   . History of pneumonia     hx of;last time around the 80's  . History of bronchitis     last time a couple of yrs ago  . Bell's palsy   . Headache(784.0)     d/t Bells palsy and last headache 82months ago  . Peripheral neuropathy     takes Lyrica daily  . Fibromyalgia   . Joint Swelling and Pain     knees,shoulders,elbows,and hands  . Back pain     pt states over 100 bones spurs on her left hip;scoliosis  . History of gastric ulcer   . Multiple allergies  uses Flonase daily as needed  . History of colon polyps   . History of MRSA infection 2005  . History of shingles 2005  . Obstructive sleep apnea     a. mild OSA by 08/16/11 sleep study [facility]);  b. does not tolerate CPAP.  Marland Kitchen Mitral valve disease     Past Surgical History  Procedure Laterality Date  . Nasal sinus surgery    . Appendectomy    . Cholecystectomy    . Incisional hernia repair    . Breast biopsy Left   . Cardiac catheterization  around 2007/2014    x 2  . Ganglion cyst removed Left   . Colonoscopy    . Esophagogastroduodenoscopy    . Multiple tooth extractions  05/21/2013    TOOTH #12  #19  WITH ALVEOLOPLASTY    . Multiple extractions with alveoloplasty N/A 05/21/2013    Procedure: Extraction of tooth #'s 12,19, and 30 with alveoloplasty and gross debridement of remaining teeth;  Surgeon: [name], DDS;  Location: [place];  Service: Oral Surgery;  Laterality: N/A;    Family History  Problem Relation Age  of Onset  . Hyperlipidemia Mother   . Hypertension Mother   . Heart disease Mother   . Hyperlipidemia Father   . Diabetes Father     DM 2   . Hypertension Father   . Heart disease Father     Social History:  reports that she has been smoking Cigarettes.  She started smoking about 39 years ago. She has a 9.5 pack-year smoking history. She has never used smokeless tobacco. She reports that she does not drink alcohol or use illicit drugs.  Allergies:  Allergies  Allergen Reactions  . Sulfa Antibiotics Anaphylaxis    Anaphylaxis with Sulfa only when combined with 800 mg Ibuprofen  . Adhesive [Tape]     Breaks out    Medications:  Scheduled Meds: . atorvastatin  80 mg Oral q1800  . carvedilol  25 mg Oral BID WC  . insulin aspart  0-15 Units Subcutaneous TID WC  . insulin glargine  80 Units Subcutaneous BID  . irbesartan  150 mg Oral Daily  . metFORMIN  1,000 mg Oral BID WC  . omega-3 acid ethyl esters  4 g Oral Daily  . pantoprazole  40 mg Oral Daily  . pregabalin  150 mg Oral QPM  . vancomycin  1,500 mg Intravenous Q24H   Continuous Infusions: . lactated ringers 5,638 application (75/64/33 2951)   PRN Meds:.acetaminophen, albuterol, clonazePAM, cyclobenzaprine, fluticasone, HYDROcodone-acetaminophen, morphine injection, nitroGLYCERIN, oxyCODONE-acetaminophen  Results for orders placed during the hospital encounter of 05/21/13 (from the past 48 hour(s))  GLUCOSE, CAPILLARY     Status: Abnormal   Collection Time    05/21/13  6:40 AM      Result Value Ref Range   Glucose-Capillary 166 (*) 70 - 99 mg/dL  GLUCOSE, CAPILLARY     Status: Abnormal   Collection Time    05/21/13 10:20 AM      Result Value Ref Range   Glucose-Capillary 138 (*) 70 - 99 mg/dL  GLUCOSE, CAPILLARY     Status: Abnormal   Collection Time    05/21/13  4:01 PM      Result Value Ref Range   Glucose-Capillary 172 (*) 70 - 99 mg/dL  GLUCOSE, CAPILLARY     Status: Abnormal   Collection Time    05/21/13   9:30 PM      Result Value Ref  Range   Glucose-Capillary 304 (*) 70 - 99 mg/dL   Comment 1 Notify RN     Comment 2 Documented in Chart    URINALYSIS, ROUTINE W REFLEX MICROSCOPIC     Status: Abnormal   Collection Time    05/22/13  6:00 AM      Result Value Ref Range   Color, Urine YELLOW  YELLOW   APPearance CLOUDY (*) CLEAR   Specific Gravity, Urine 1.020  1.005 - 1.030   pH 5.0  5.0 - 8.0   Glucose, UA NEGATIVE  NEGATIVE mg/dL   Hgb urine dipstick TRACE (*) NEGATIVE   Bilirubin Urine NEGATIVE  NEGATIVE   Ketones, ur NEGATIVE  NEGATIVE mg/dL   Protein, ur >300 (*) NEGATIVE mg/dL   Urobilinogen, UA 0.2  0.0 - 1.0 mg/dL   Nitrite NEGATIVE  NEGATIVE   Leukocytes, UA TRACE (*) NEGATIVE  URINE MICROSCOPIC-ADD ON     Status: Abnormal   Collection Time    05/22/13  6:00 AM      Result Value Ref Range   Squamous Epithelial / LPF FEW (*) RARE   WBC, UA 3-6  <3 WBC/hpf   RBC / HPF 3-6  <3 RBC/hpf   Bacteria, UA RARE  RARE   Casts HYALINE CASTS (*) NEGATIVE  GLUCOSE, CAPILLARY     Status: Abnormal   Collection Time    05/22/13  8:31 AM      Result Value Ref Range   Glucose-Capillary 165 (*) 70 - 99 mg/dL  GLUCOSE, CAPILLARY     Status: Abnormal   Collection Time    05/22/13 12:13 PM      Result Value Ref Range   Glucose-Capillary 248 (*) 70 - 99 mg/dL  BASIC METABOLIC PANEL     Status: Abnormal   Collection Time    05/22/13  1:40 PM      Result Value Ref Range   Sodium 135 (*) 137 - 147 mEq/L   Potassium 5.3  3.7 - 5.3 mEq/L   Chloride 97  96 - 112 mEq/L   CO2 24  19 - 32 mEq/L   Glucose, Bld 228 (*) 70 - 99 mg/dL   BUN 39 (*) 6 - 23 mg/dL   Creatinine, Ser 2.01 (*) 0.50 - 1.10 mg/dL   Calcium 8.7  8.4 - 10.5 mg/dL   GFR calc non Af Amer 28 (*) >90 mL/min   GFR calc Af Amer 32 (*) >90 mL/min   Comment: (NOTE)     The eGFR has been calculated using the CKD EPI equation.     This calculation has not been validated in all clinical situations.     eGFR's persistently <90  mL/min signify possible Chronic Kidney     Disease.  CBC WITH DIFFERENTIAL     Status: Abnormal   Collection Time    05/22/13  1:40 PM      Result Value Ref Range   WBC 12.4 (*) 4.0 - 10.5 K/uL   RBC 3.43 (*) 3.87 - 5.11 MIL/uL   Hemoglobin 10.6 (*) 12.0 - 15.0 g/dL   HCT 32.5 (*) 36.0 - 46.0 %   MCV 94.8  78.0 - 100.0 fL   MCH 30.9  26.0 - 34.0 pg   MCHC 32.6  30.0 - 36.0 g/dL   RDW 14.9  11.5 - 15.5 %   Platelets 253  150 - 400 K/uL   Neutrophils Relative % 78 (*) 43 - 77 %   Neutro Abs 9.7 (*)  1.7 - 7.7 K/uL   Lymphocytes Relative 17  12 - 46 %   Lymphs Abs 2.1  0.7 - 4.0 K/uL   Monocytes Relative 4  3 - 12 %   Monocytes Absolute 0.5  0.1 - 1.0 K/uL   Eosinophils Relative 1  0 - 5 %   Eosinophils Absolute 0.1  0.0 - 0.7 K/uL   Basophils Relative 0  0 - 1 %   Basophils Absolute 0.0  0.0 - 0.1 K/uL    Dg Chest Port 1 View  05/22/2013   CLINICAL DATA:  Fever.  EXAM: PORTABLE CHEST - 1 VIEW  COMPARISON:  12/05/2012  FINDINGS: Lungs are hypoinflated with mild prominence of the perihilar markings which may represent mild vascular congestion versus secondary to the degree of hypoinflation. There is mild opacification over the right suprahilar/paramediastinal region as cannot exclude infection. There is mild stable cardiomegaly. Remainder the exam is unchanged.  IMPRESSION: Mild opacification of the right super hilar/paramediastinal region as cannot exclude infection. PA and lateral chest x-ray will be helpful for further evaluation.  Possible mild vascular congestion.  Cardiomegaly.   Electronically Signed   By: Marin Olp M.D.   On: 05/22/2013 00:18    Review of Systems  Constitutional: Positive for fever, chills and diaphoresis. Negative for weight loss and malaise/fatigue.  Respiratory: Positive for shortness of breath. Negative for wheezing.   Cardiovascular: Negative for chest pain and palpitations.  Gastrointestinal: Negative for nausea, vomiting and abdominal pain.   Genitourinary: Negative for dysuria, urgency, frequency, hematuria and flank pain.  Musculoskeletal: Positive for back pain and myalgias.  Neurological: Negative for dizziness and loss of consciousness.   Blood pressure 142/75, pulse 109, temperature 103.1 F (39.5 C), temperature source Oral, resp. rate 20, height $RemoveBe'5\' 2"'EUdHBCcsp$  (1.575 m), weight 225 lb 6.4 oz (102.241 kg), SpO2 90.00%. Physical Exam  Constitutional: She appears well-developed and well-nourished. No distress.  Cardiovascular: Normal rate, regular rhythm and intact distal pulses.   Murmur heard. Respiratory: Effort normal and breath sounds normal. No respiratory distress. She has no wheezes. She has no rales.  GI: Soft. Bowel sounds are normal. She exhibits no distension and no mass. There is no tenderness. There is no rebound and no guarding.  Musculoskeletal: Normal range of motion.  Neurological: She is alert.  Skin: Skin is warm and dry. She is not diaphoretic.  Left groin-4x3cm area of erythema, fluctuance.  There is  Induration/swelling noted superior to the abscess.  Psychiatric: She has a normal mood and affect. Her behavior is normal. Thought content normal.    Assessment/Plan: Left groin abscess S/p bedside I&D, please refer to procedure note for full details -follow cultures -agree with Vanc and Zosyn -reinforce dressing, will plan to change tomorrow and start BID wet to dry dressing changes -recommend tighter glucose control, check A1C   Kareen Hitsman ANP-BC Pager [phone] 05/22/2013, 4:35 PM

## 2013-05-22 NOTE — Progress Notes (Signed)
POST OPERATIVE NOTE: Post op Day #1  05/22/2013 Audrey Olson [id number]  VITALS: BP 142/75  Pulse 109  Temp(Src) 103.1 F (39.5 C) (Oral)  Resp 20  Ht 5\' 2"  (1.575 m)  Wt 225 lb 6.4 oz (102.241 kg)  BMI 41.22 kg/m2  SpO2 90%  LABS:  Lab Results  Component Value Date   WBC 12.4* 05/22/2013   HGB 10.6* 05/22/2013   HCT 32.5* 05/22/2013   MCV 94.8 05/22/2013   PLT 253 05/22/2013   BMET    Component Value Date/Time   NA 135* 05/22/2013 1340   K 5.3 05/22/2013 1340   CL 97 05/22/2013 1340   CO2 24 05/22/2013 1340   GLUCOSE 228* 05/22/2013 1340   BUN 39* 05/22/2013 1340   CREATININE 2.01* 05/22/2013 1340   CALCIUM 8.7 05/22/2013 1340   GFRNONAA 28* 05/22/2013 1340   GFRAA 32* 05/22/2013 1340    No results found for this basename: INR, PROTIME   No results found for this basename: PTT     Audrey Olson is status post extraction of three teeth with alveoloplasty and gross debridement of teeth in OR yesterday. Patient was being kept for 23 hour observation when leukocytosis and fevers noted. Pt. Subsequent foudnto have groin abscess with anticipated intervention today.  SUBJECTIVE: Patient denies any significant oral discomfort. No bleeding by report.  EXAM: No sign of oral infection, heme, or ooze. Sutures are intact. Clots present. Some ecchymoses noted especially in submental/lower chins area.   ASSESSMENT: Post operative course is consistent with dental procedures performed in the OR. Groin abscess.  PLAN: 1. Salt water rinses every two hours while awake. 2. Advance diet as tolerated. 3. OK for discharge from dental standpoint. 4. F/U in Dental Medicine on 06/02/13 Call if problems arise before then.  [name], DDS

## 2013-05-22 NOTE — Telephone Encounter (Signed)
Pt is currently admitted at [facility].

## 2013-05-22 NOTE — Consult Note (Signed)
[place] for Infectious Disease    Date of Admission:  05/21/2013  Date of Consult:  05/22/2013  Reason for Consult: Fevers, groin abscess Referring Physician: Dr. Gwenlyn Found   HPI: Audrey Olson is an 53 y.o. female admittted by Dr. Tommie Raymond for multiple extractions and alveoloplasty admitted to Cardiology postoperatively. Postoperatively she began to have fevers. CXR done with some minimal opacity in right hilar region . UA 3-6 wbc. Blood cultures taken and pt started on vanco and zosyn.  It was then subsequently discovered that patient had left sided groin abscess.  On my interview today she stated that it had been there for at least last 10 days but she had not alerted anyone. She has no hx of boils, MRSA to her knowledge.         Past Medical History  Diagnosis Date  . Lumbar spondylosis   . Hyperlipidemia     takes Crestor daily as well as Lovaza  . Acid reflux   . Restless legs syndrome (RLS)   . Vitamin D deficiency     takes Vit D 50000 units weekly  . History of rectal bleeding   . Hepatomegaly     With splenomegaly  . Unspecified urinary incontinence   . Degeneration of lumbar or lumbosacral intervertebral disc   . Fatty pancreas   . Internal hemorrhoids   . LVH (left ventricular hypertrophy)   . Generalized anxiety disorder   . Anemia   . Hyponatremia   . Disorders of magnesium metabolism   . Alpha-1-antitrypsin deficiency     pt is not aware of this diagnosis  . Hemangioma of spleen   . Chronic combined systolic and diastolic CHF (congestive heart failure)     a. 04/2013 Echo: EF 25-30%.  . Non-ischemic cardiomyopathy     a. 04/2013 Echo: EF 25-30%, glob HK, basal and mid antsept/infersept/ant AK, Grade 4 DD, Triv AI/MR, mod dil LA.  . Mitral regurgitation     a. Prev noted to be mod-sev by TEE 02/2013;  b. 04/2013 Echo: EF 25-30%, triv MR.  . Diabetic neuropathy   . Chronic pain   . Tobacco abuse   . Severe obesity (BMI 35.0-39.9) with  comorbidity   . Arthritis     Lower back and both hips with severe chronic pain and limited mobility  . Osteoporosis   . COPD (chronic obstructive pulmonary disease)     uses Albuterol daily as needed  . Panic attacks     takes Klonopin nightly  . Anxiety   . Type II diabetes mellitus     Insulin-dependent since 2008 - takes Novolog and Lantus daily as well as Metformin  . Hypertension     takes Diovan daily  . Asthma     as a child   . History of pneumonia     hx of;last time around the 80's  . History of bronchitis     last time a couple of yrs ago  . Bell's palsy   . Headache(784.0)     d/t Bells palsy and last headache 36months ago  . Peripheral neuropathy     takes Lyrica daily  . Fibromyalgia   . Joint Swelling and Pain     knees,shoulders,elbows,and hands  . Back pain     pt states over 100 bones spurs on her left hip;scoliosis  . History of gastric ulcer   . Multiple allergies     uses Flonase daily as needed  . History of colon  polyps   . History of MRSA infection 2005  . History of shingles 2005  . Obstructive sleep apnea     a. mild OSA by 08/16/11 sleep study [facility]);  b. does not tolerate CPAP.  Marland Kitchen Mitral valve disease     Past Surgical History  Procedure Laterality Date  . Nasal sinus surgery    . Appendectomy    . Cholecystectomy    . Incisional hernia repair    . Breast biopsy Left   . Cardiac catheterization  around 2007/2014    x 2  . Ganglion cyst removed Left   . Colonoscopy    . Esophagogastroduodenoscopy    . Multiple tooth extractions  05/21/2013    TOOTH #12  #19  WITH ALVEOLOPLASTY    . Multiple extractions with alveoloplasty N/A 05/21/2013    Procedure: Extraction of tooth #'s 12,19, and 30 with alveoloplasty and gross debridement of remaining teeth;  Surgeon: [name], DDS;  Location: [facility];  Service: Oral Surgery;  Laterality: N/A;  ergies:   Allergies  Allergen Reactions  . Sulfa Antibiotics Anaphylaxis     Anaphylaxis with Sulfa only when combined with 800 mg Ibuprofen  . Adhesive [Tape]     Breaks out     Medications: I have reviewed patients current medications as documented in Epic Anti-infectives   Start     Dose/Rate Route Frequency Ordered Stop   05/22/13 2200  vancomycin (VANCOCIN) 1,500 mg in sodium chloride 0.9 % 500 mL IVPB     1,500 mg 250 mL/hr over 120 Minutes Intravenous Every 24 hours 05/21/13 2310     05/21/13 2330  piperacillin-tazobactam (ZOSYN) IVPB 3.375 g  Status:  Discontinued     3.375 g 12.5 mL/hr over 240 Minutes Intravenous 3 times per day 05/21/13 2307 05/22/13 1521   05/21/13 2315  vancomycin (VANCOCIN) 2,000 mg in sodium chloride 0.9 % 500 mL IVPB     2,000 mg 250 mL/hr over 120 Minutes Intravenous  Once 05/21/13 2310 05/22/13 0243   05/21/13 0721  ceFAZolin (ANCEF) 2-3 GM-% IVPB SOLR    Comments:  Precious Haws   : cabinet override      05/21/13 0721 05/21/13 1929   05/21/13 0000  ceFAZolin (ANCEF) IVPB 2 g/50 mL premix     2 g 100 mL/hr over 30 Minutes Intravenous  Once 05/20/13 1438 05/21/13 9702      Social History:  reports that she has been smoking Cigarettes.  She started smoking about 39 years ago. She has a 9.5 pack-year smoking history. She has never used smokeless tobacco. She reports that she does not drink alcohol or use illicit drugs.  Family History  Problem Relation Age of Onset  . Hyperlipidemia Mother   . Hypertension Mother   . Heart disease Mother   . Hyperlipidemia Father   . Diabetes Father     DM 2   . Hypertension Father   . Heart disease Father     As in HPI and primary teams notes otherwise 12 point review of systems is negative  Blood pressure 142/75, pulse 109, temperature 103.1 F (39.5 C), temperature source Oral, resp. rate 20, height $RemoveBe'5\' 2"'[id number]$  (1.575 m), weight 225 lb 6.4 oz (102.241 kg), SpO2 90.00%. General: Alert and awake, oriented x3, not in any acute distress. HEENT: anicteric sclera,EOMI, oropharynx clear and  without exudate, sp dental extractions CVS regular rate, normal r,  no murmur rubs or gallops Chest: clear to auscultation bilaterally, no wheezing,  rales or rhonchi Abdomen: soft nontender, nondistended, normal bowel sounds, Skin: left sided abscess tender erythematous, fluctuant Neuro: nonfocal, strength    Results for orders placed during the hospital encounter of 05/21/13 (from the past 48 hour(s))  GLUCOSE, CAPILLARY     Status: Abnormal   Collection Time    05/21/13  6:40 AM      Result Value Ref Range   Glucose-Capillary 166 (*) 70 - 99 mg/dL  GLUCOSE, CAPILLARY     Status: Abnormal   Collection Time    05/21/13 10:20 AM      Result Value Ref Range   Glucose-Capillary 138 (*) 70 - 99 mg/dL  GLUCOSE, CAPILLARY     Status: Abnormal   Collection Time    05/21/13  4:01 PM      Result Value Ref Range   Glucose-Capillary 172 (*) 70 - 99 mg/dL  GLUCOSE, CAPILLARY     Status: Abnormal   Collection Time    05/21/13  9:30 PM      Result Value Ref Range   Glucose-Capillary 304 (*) 70 - 99 mg/dL   Comment 1 Notify RN     Comment 2 Documented in Chart    URINALYSIS, ROUTINE W REFLEX MICROSCOPIC     Status: Abnormal   Collection Time    05/22/13  6:00 AM      Result Value Ref Range   Color, Urine YELLOW  YELLOW   APPearance CLOUDY (*) CLEAR   Specific Gravity, Urine 1.020  1.005 - 1.030   pH 5.0  5.0 - 8.0   Glucose, UA NEGATIVE  NEGATIVE mg/dL   Hgb urine dipstick TRACE (*) NEGATIVE   Bilirubin Urine NEGATIVE  NEGATIVE   Ketones, ur NEGATIVE  NEGATIVE mg/dL   Protein, ur >300 (*) NEGATIVE mg/dL   Urobilinogen, UA 0.2  0.0 - 1.0 mg/dL   Nitrite NEGATIVE  NEGATIVE   Leukocytes, UA TRACE (*) NEGATIVE  URINE MICROSCOPIC-ADD ON     Status: Abnormal   Collection Time    05/22/13  6:00 AM      Result Value Ref Range   Squamous Epithelial / LPF FEW (*) RARE   WBC, UA 3-6  <3 WBC/hpf   RBC / HPF 3-6  <3 RBC/hpf   Bacteria, UA RARE  RARE   Casts HYALINE CASTS (*) NEGATIVE    GLUCOSE, CAPILLARY     Status: Abnormal   Collection Time    05/22/13  8:31 AM      Result Value Ref Range   Glucose-Capillary 165 (*) 70 - 99 mg/dL  GLUCOSE, CAPILLARY     Status: Abnormal   Collection Time    05/22/13 12:13 PM      Result Value Ref Range   Glucose-Capillary 248 (*) 70 - 99 mg/dL  BASIC METABOLIC PANEL     Status: Abnormal   Collection Time    05/22/13  1:40 PM      Result Value Ref Range   Sodium 135 (*) 137 - 147 mEq/L   Potassium 5.3  3.7 - 5.3 mEq/L   Chloride 97  96 - 112 mEq/L   CO2 24  19 - 32 mEq/L   Glucose, Bld 228 (*) 70 - 99 mg/dL   BUN 39 (*) 6 - 23 mg/dL   Creatinine, Ser 2.01 (*) 0.50 - 1.10 mg/dL   Calcium 8.7  8.4 - 10.5 mg/dL   GFR calc non Af Amer 28 (*) >90 mL/min   GFR calc Af Amer 32 (*) >  90 mL/min   Comment: (NOTE)     The eGFR has been calculated using the CKD EPI equation.     This calculation has not been validated in all clinical situations.     eGFR's persistently <90 mL/min signify possible Chronic Kidney     Disease.  CBC WITH DIFFERENTIAL     Status: Abnormal   Collection Time    05/22/13  1:40 PM      Result Value Ref Range   WBC 12.4 (*) 4.0 - 10.5 K/uL   RBC 3.43 (*) 3.87 - 5.11 MIL/uL   Hemoglobin 10.6 (*) 12.0 - 15.0 g/dL   HCT 32.5 (*) 36.0 - 46.0 %   MCV 94.8  78.0 - 100.0 fL   MCH 30.9  26.0 - 34.0 pg   MCHC 32.6  30.0 - 36.0 g/dL   RDW 14.9  11.5 - 15.5 %   Platelets 253  150 - 400 K/uL   Neutrophils Relative % 78 (*) 43 - 77 %   Neutro Abs 9.7 (*) 1.7 - 7.7 K/uL   Lymphocytes Relative 17  12 - 46 %   Lymphs Abs 2.1  0.7 - 4.0 K/uL   Monocytes Relative 4  3 - 12 %   Monocytes Absolute 0.5  0.1 - 1.0 K/uL   Eosinophils Relative 1  0 - 5 %   Eosinophils Absolute 0.1  0.0 - 0.7 K/uL   Basophils Relative 0  0 - 1 %   Basophils Absolute 0.0  0.0 - 0.1 K/uL  GLUCOSE, CAPILLARY     Status: Abnormal   Collection Time    05/22/13  4:46 PM      Result Value Ref Range   Glucose-Capillary 144 (*) 70 - 99 mg/dL    Comment 1 Notify RN     No results found for this basename: sdes, specrequest, cult, reptstatus   Dg Chest Port 1 View  05/22/2013   CLINICAL DATA:  Fever.  EXAM: PORTABLE CHEST - 1 VIEW  COMPARISON:  12/05/2012  FINDINGS: Lungs are hypoinflated with mild prominence of the perihilar markings which may represent mild vascular congestion versus secondary to the degree of hypoinflation. There is mild opacification over the right suprahilar/paramediastinal region as cannot exclude infection. There is mild stable cardiomegaly. Remainder the exam is unchanged.  IMPRESSION: Mild opacification of the right super hilar/paramediastinal region as cannot exclude infection. PA and lateral chest x-ray will be helpful for further evaluation.  Possible mild vascular congestion.  Cardiomegaly.   Electronically Signed   By: Marin Olp M.D.   On: 05/22/2013 00:18     No results found for this or any previous visit (from the past 720 hour(s)).   Impression/Recommendation  Active Problems:   Mitral valve disease   Dental caries   Audrey Olson is a 53 y.o. female with  Post-operative fevers that were due to a pre-existing groing abscess   #1 Groin abscess:  Greatly appreciate [facility] performing I and D for culture  Statistically this is most likely to be due to skin flora such as MRSA, MSSA, GAS, GBS. It is conceivable we could be dealing with GNR such as an E coli given location  I will dc zosyn  Continue Vancomycin as would cover "ususal suspects"  I and D will be most important therapeutic maneuver  Followup culture from I and D  #2 Renal insufficiency: will have to be careful with vancomycin, and other potential nephrotoxins,   #3 Screening: check HIV and hepatitis  panel     05/22/2013, 7:37 PM   Thank you so much for this interesting consult  [place] for [place] [phone] (pager) [phone] (office) 05/22/2013, 7:37 PM  [place] 05/22/2013, 7:37 PM

## 2013-05-22 NOTE — Progress Notes (Signed)
UR completed. Patient changed to inpatient- requiring IV antibiotcs X 2

## 2013-05-22 NOTE — Progress Notes (Signed)
Subjective: Pt states she has been shaking and is a little cold. Denies CP and SOB.   Objective: Vital signs in last 24 hours: Temp:  [99.7 F (37.6 C)-104.1 F (40.1 C)] 99.7 F (37.6 C) (04/09 0533) Pulse Rate:  [92-111] 92 (04/09 0533) Resp:  [18-22] 20 (04/09 0533) BP: (95-133)/(40-79) 100/64 mmHg (04/09 0533) SpO2:  [89 %-97 %] 93 % (04/09 0533) Weight:  [222 lb (100.699 kg)-225 lb 6.4 oz (102.241 kg)] 225 lb 6.4 oz (102.241 kg) (04/09 0533) Last BM Date: 05/20/13  Intake/Output from previous day: 04/08 0701 - 04/09 0700 In: 1540 [P.O.:240; I.V.:1300] Out: 500 [Urine:450; Blood:50] Intake/Output this shift: Total I/O In: 340 [P.O.:340] Out: 500 [Urine:500]  Medications Current Facility-Administered Medications  Medication Dose Route Frequency Provider Last Rate Last Dose  . acetaminophen (TYLENOL) tablet 650 mg  650 mg Oral Q6H PRN Theressa Stamps, MD   650 mg at 05/21/13 2259  . albuterol (PROVENTIL) (2.5 MG/3ML) 0.083% nebulizer solution 2.5 mg  2.5 mg Nebulization Q6H PRN Troy Sine, MD      . atorvastatin (LIPITOR) tablet 80 mg  80 mg Oral q1800 Rogelia Mire, NP   80 mg at 05/21/13 1754  . carvedilol (COREG) tablet 25 mg  25 mg Oral BID WC Rogelia Mire, NP   25 mg at 05/22/13 1004  . clonazePAM (KLONOPIN) tablet 1 mg  1 mg Oral QHS PRN Rogelia Mire, NP      . cyclobenzaprine (FLEXERIL) tablet 10 mg  10 mg Oral TID PRN Rogelia Mire, NP      . fluticasone (FLONASE) 50 MCG/ACT nasal spray 2 spray  2 spray Each Nare QHS PRN Rogelia Mire, NP      . HYDROcodone-acetaminophen (NORCO) 10-325 MG per tablet 1 tablet  1 tablet Oral Q6H PRN Rogelia Mire, NP      . insulin aspart (novoLOG) injection 0-15 Units  0-15 Units Subcutaneous TID WC Rogelia Mire, NP   3 Units at 05/22/13 1008  . insulin glargine (LANTUS) injection 80 Units  80 Units Subcutaneous BID Rogelia Mire, NP   80 Units at 05/22/13 1007  . irbesartan  (AVAPRO) tablet 150 mg  150 mg Oral Daily Rogelia Mire, NP   150 mg at 05/22/13 1004  . lactated ringers infusion   Intravenous Continuous [name], DDS 20 mL/hr at [id number] application at [id number] 1700  . metFORMIN (GLUCOPHAGE) tablet 1,000 mg  1,000 mg Oral BID WC Rogelia Mire, NP   1,000 mg at 05/22/13 1004  . morphine 2 MG/ML injection 2-4 mg  2-4 mg Intravenous Q2H PRN [name], DDS      . nitroGLYCERIN (NITROSTAT) SL tablet 0.4 mg  0.4 mg Sublingual Q5 min PRN Rogelia Mire, NP      . omega-3 acid ethyl esters (LOVAZA) capsule 4 g  4 g Oral Daily Rogelia Mire, NP   4 g at 05/22/13 1004  . oxyCODONE-acetaminophen (PERCOCET/ROXICET) 5-325 MG per tablet 1-2 tablet  1-2 tablet Oral Q4H PRN [name], DDS   2 tablet at 05/21/13 1934  . pantoprazole (PROTONIX) EC tablet 40 mg  40 mg Oral Daily Rogelia Mire, NP   40 mg at 05/22/13 1004  . piperacillin-tazobactam (ZOSYN) IVPB 3.375 g  3.375 g Intravenous 3 times per day Troy Sine, MD   3.375 g at 05/22/13 0636  . pregabalin (LYRICA) capsule 150 mg  150  mg Oral QPM Rogelia Mire, NP   150 mg at 05/21/13 1754  . vancomycin (VANCOCIN) 1,500 mg in sodium chloride 0.9 % 500 mL IVPB  1,500 mg Intravenous Q24H Troy Sine, MD        PE: General appearance: alert, cooperative and no distress Lungs: clear to auscultation bilaterally Heart: regular rate and rhythm Extremities: no LEE Pulses: 2+ and symmetric Skin: warm and dry Neurologic: Grossly normal  Lab Results:   Recent Labs  05/20/13 0925  WBC 13.6*  HGB 11.8*  HCT 34.7*  PLT 253   BMET  Recent Labs  05/20/13 0925  NA 136*  K 4.8  CL 95*  CO2 24  GLUCOSE 406*  BUN 37*  CREATININE 1.31*  CALCIUM 9.5    Assessment/Plan    Active Problems:   Mitral valve disease   Dental caries  Plan: S/p multiple dental extractions. Pt was febrile earlier this am with a Temp of 104.1. Most recent temp  was 99.7. She also states that she "can't stop shaking". WBC was 13.6 2 days ago. Will have RN recheck vitals and will order a CBC to trend WBCs.      LOS: 1 day    Brittainy M. Ladoris Gene 05/22/2013 12:11 PM  Agree with note written by Ellen Henri  PAC  S/P teeth extraction. Febrile this AM secondary to abscess. On Zosyn. WIll ask ID appropriate type and duration of ATBX oral Rx prior to D/C. No CP/SOB. Exam benign.   Lorretta Harp 05/22/2013 2:21 PM

## 2013-05-22 NOTE — Procedures (Signed)
Procedure performed by NP with my approval at the bedside.  Kathryne Eriksson. Dahlia Bailiff, MD, [place] [phone] [facility]

## 2013-05-22 NOTE — Progress Notes (Signed)
Went in to pt's room while NT getting VS. SpO2 was reading very low, had rectal temp of 104.1, and only alert to self.  Pt did not have O2 on. Placed pt on 4L O2 and pt's SpO2 came up to low 80's. Increased O2 to 6L and SpO2 increased to 89%. RT called for venti mask. RT stated she would send one in about 15 min but O2 was ok at this time as long as pt was not in respiratory distress. Pt was only having slightly labored breathing but did not appear to be in distress. RR called and on call MD notified. RR came to assess pt. Ice packs were placed on pt while waiting for MD to return page. MD gave orders for Tylenol, CXR, blood cultures, and IV abx. Tylenol and abx given. Temp now 101, SpO2 92% on 6L, and pt is A&O x4. Will continue to monitor pt.

## 2013-05-23 DIAGNOSIS — L02214 Cutaneous abscess of groin: Secondary | ICD-10-CM | POA: Diagnosis present

## 2013-05-23 DIAGNOSIS — L03319 Cellulitis of trunk, unspecified: Secondary | ICD-10-CM

## 2013-05-23 DIAGNOSIS — I059 Rheumatic mitral valve disease, unspecified: Secondary | ICD-10-CM

## 2013-05-23 DIAGNOSIS — I5042 Chronic combined systolic (congestive) and diastolic (congestive) heart failure: Secondary | ICD-10-CM

## 2013-05-23 DIAGNOSIS — I428 Other cardiomyopathies: Secondary | ICD-10-CM

## 2013-05-23 DIAGNOSIS — K045 Chronic apical periodontitis: Principal | ICD-10-CM

## 2013-05-23 DIAGNOSIS — I509 Heart failure, unspecified: Secondary | ICD-10-CM

## 2013-05-23 DIAGNOSIS — L02219 Cutaneous abscess of trunk, unspecified: Secondary | ICD-10-CM

## 2013-05-23 DIAGNOSIS — K089 Disorder of teeth and supporting structures, unspecified: Secondary | ICD-10-CM

## 2013-05-23 LAB — GLUCOSE, CAPILLARY
Glucose-Capillary: 111 mg/dL — ABNORMAL HIGH (ref 70–99)
Glucose-Capillary: 140 mg/dL — ABNORMAL HIGH (ref 70–99)
Glucose-Capillary: 160 mg/dL — ABNORMAL HIGH (ref 70–99)

## 2013-05-23 LAB — URINE CULTURE
COLONY COUNT: NO GROWTH
CULTURE: NO GROWTH

## 2013-05-23 LAB — BASIC METABOLIC PANEL
BUN: 39 mg/dL — AB (ref 6–23)
CALCIUM: 8.3 mg/dL — AB (ref 8.4–10.5)
CHLORIDE: 102 meq/L (ref 96–112)
CO2: 22 meq/L (ref 19–32)
CREATININE: 2 mg/dL — AB (ref 0.50–1.10)
GFR calc Af Amer: 32 mL/min — ABNORMAL LOW (ref >90)
GFR calc non Af Amer: 28 mL/min — ABNORMAL LOW (ref >90)
GLUCOSE: 141 mg/dL — AB (ref 70–99)
Potassium: 4.7 mEq/L (ref 3.7–5.3)
Sodium: 138 mEq/L (ref 137–147)

## 2013-05-23 LAB — CBC
HEMATOCRIT: 29.6 % — AB (ref 36.0–46.0)
HEMOGLOBIN: 9.6 g/dL — AB (ref 12.0–15.0)
MCH: 30.8 pg (ref 26.0–34.0)
MCHC: 32.4 g/dL (ref 30.0–36.0)
MCV: 94.9 fL (ref 78.0–100.0)
Platelets: 218 10*3/uL (ref 150–400)
RBC: 3.12 MIL/uL — AB (ref 3.87–5.11)
RDW: 14.9 % (ref 11.5–15.5)
WBC: 11.5 10*3/uL — AB (ref 4.0–10.5)

## 2013-05-23 LAB — HEMOGLOBIN A1C
Hgb A1c MFr Bld: 11.6 % — ABNORMAL HIGH (ref <5.7)
Mean Plasma Glucose: 286 mg/dL — ABNORMAL HIGH (ref <117)

## 2013-05-23 LAB — HIV ANTIBODY (ROUTINE TESTING W REFLEX): HIV: NONREACTIVE

## 2013-05-23 LAB — HEPATITIS PANEL, ACUTE
HCV AB: NEGATIVE
HEP A IGM: NONREACTIVE
HEP B S AG: NEGATIVE
Hep B C IgM: NONREACTIVE

## 2013-05-23 MED ORDER — CEPHALEXIN 500 MG PO CAPS: 500.0000 mg | ORAL_CAPSULE | Freq: Three times a day (TID) | ORAL | Status: DC

## 2013-05-23 MED ORDER — DOXYCYCLINE HYCLATE 100 MG PO TABS
100.0000 mg | ORAL_TABLET | Freq: Two times a day (BID) | ORAL | Status: DC
  Administered 2013-05-23: 100 mg via ORAL
  Filled 2013-05-23 (×2): qty 1

## 2013-05-23 MED ORDER — DOXYCYCLINE HYCLATE 100 MG PO TABS: 100.0000 mg | ORAL_TABLET | Freq: Two times a day (BID) | ORAL | Status: DC

## 2013-05-23 MED ORDER — CEPHALEXIN 500 MG PO CAPS
500.0000 mg | ORAL_CAPSULE | Freq: Three times a day (TID) | ORAL | Status: DC
  Administered 2013-05-23: 500 mg via ORAL
  Filled 2013-05-23 (×3): qty 1

## 2013-05-23 NOTE — Progress Notes (Signed)
Pt was on 6L of 02 due to SOB a night ago. Pt was tested  RA sitting and stat between 92-94%. While walking pt's stats became 90-92% on room air. Will continue to monitor.

## 2013-05-23 NOTE — Progress Notes (Signed)
[name] to be D/C'd Home per MD order.  Discussed with the patient and all questions fully answered.    Medication List         albuterol 108 (90 BASE) MCG/ACT inhaler  Commonly known as:  PROVENTIL HFA;VENTOLIN HFA  Inhale 2 puffs into the lungs every 6 (six) hours as needed for wheezing or shortness of breath.     aspirin EC 81 MG tablet  Take 81 mg by mouth daily.     carvedilol 25 MG tablet  Commonly known as:  COREG  Take 25 mg by mouth 2 (two) times daily with a meal.     cephALEXin 500 MG capsule  Commonly known as:  KEFLEX  Take 1 capsule (500 mg total) by mouth 3 (three) times daily.     clonazePAM 1 MG tablet  Commonly known as:  KLONOPIN  Take 1 mg by mouth at bedtime as needed for anxiety.     cyclobenzaprine 10 MG tablet  Commonly known as:  FLEXERIL  Take 10 mg by mouth 3 (three) times daily as needed for muscle spasms.     doxycycline 100 MG tablet  Commonly known as:  VIBRA-TABS  Take 1 tablet (100 mg total) by mouth 2 (two) times daily.     fluticasone 50 MCG/ACT nasal spray  Commonly known as:  FLONASE  Place 2 sprays into both nostrils at bedtime as needed for allergies.     HYDROcodone-acetaminophen 10-325 MG per tablet  Commonly known as:  NORCO  Take 1 tablet by mouth every 6 (six) hours as needed for moderate pain.     insulin glargine 100 UNIT/ML injection  Commonly known as:  LANTUS  Inject 80 Units into the skin 2 (two) times daily.     Magnesium Oxide 250 MG Tabs  Take 1 tablet by mouth daily.     metFORMIN 1000 MG tablet  Commonly known as:  GLUCOPHAGE  Take 1,000 mg by mouth 2 (two) times daily with a meal.     nitroGLYCERIN 0.4 MG SL tablet  Commonly known as:  NITROSTAT  Place 0.4 mg under the tongue every 5 (five) minutes as needed for chest pain.     NOVOLOG FLEXPEN 100 UNIT/ML FlexPen  Generic drug:  insulin aspart  Inject 6-36 Units into the skin 3 (three) times daily with meals. Sliding scale     omega-3 acid ethyl  esters 1 G capsule  Commonly known as:  LOVAZA  Take 4 g by mouth daily.     omeprazole 40 MG capsule  Commonly known as:  PRILOSEC  Take 40 mg by mouth daily.     pregabalin 150 MG capsule  Commonly known as:  LYRICA  Take 150 mg by mouth every evening.     rosuvastatin 40 MG tablet  Commonly known as:  CRESTOR  Take 40 mg by mouth daily.     valsartan 160 MG tablet  Commonly known as:  DIOVAN  Take 160 mg by mouth daily.     Vitamin D (Ergocalciferol) 50000 UNITS Caps capsule  Commonly known as:  DRISDOL  Take 50,000 Units by mouth every 7 (seven) days. Tuesdays        VVS, Skin clean, dry and intact without evidence of skin break down, no evidence of skin tears noted. IV catheter discontinued intact. Site without signs and symptoms of complications. Dressing and pressure applied.  An After Visit Summary was printed and given to the patient.  D/c education completed with  patient/family including follow up instructions, medication list, d/c activities limitations if indicated, with other d/c instructions as indicated by MD - patient able to verbalize understanding, all questions fully answered.   Patient instructed to return to ED, call 911, or call MD for any changes in condition.   Patient escorted via [place], and D/C home via private auto.  Henriette Combs 05/23/2013 7:21 PM

## 2013-05-23 NOTE — Progress Notes (Signed)
2 Days Post-Op  Subjective: No complaints.  Temp 100.7 this AM.    Objective: Vital signs in last 24 hours: Temp:  [98.5 F (36.9 C)-103.1 F (39.5 C)] 100.7 F (38.2 C) (04/10 0528) Pulse Rate:  [90-109] 104 (04/10 0528) Resp:  [18-20] 20 (04/10 0528) BP: (96-142)/(59-78) 125/78 mmHg (04/10 0847) SpO2:  [90 %-100 %] 95 % (04/10 0528) Weight:  [228 lb 13.4 oz (103.8 kg)] 228 lb 13.4 oz (103.8 kg) (04/10 0528) Last BM Date: 05/20/13  Intake/Output from previous day: 04/09 0701 - 04/10 0700 In: 340 [P.O.:340] Out: 1500 [Urine:1500] Intake/Output this shift: Total I/O In: -  Out: 800 [Urine:800]  PE Incision/Wound: left groin, wound is clean, no surrounding erythema, there is some surrounding inflammation without additional areas of fluctuance.    Lab Results:   Recent Labs  05/22/13 1340  WBC 12.4*  HGB 10.6*  HCT 32.5*  PLT 253   BMET  Recent Labs  05/22/13 1340  NA 135*  K 5.3  CL 97  CO2 24  GLUCOSE 228*  BUN 39*  CREATININE 2.01*  CALCIUM 8.7   PT/INR No results found for this basename: LABPROT, INR,  in the last 72 hours ABG No results found for this basename: PHART, PCO2, PO2, HCO3,  in the last 72 hours  Studies/Results: Dg Chest Port 1 View  05/22/2013   CLINICAL DATA:  Fever.  EXAM: PORTABLE CHEST - 1 VIEW  COMPARISON:  12/05/2012  FINDINGS: Lungs are hypoinflated with mild prominence of the perihilar markings which may represent mild vascular congestion versus secondary to the degree of hypoinflation. There is mild opacification over the right suprahilar/paramediastinal region as cannot exclude infection. There is mild stable cardiomegaly. Remainder the exam is unchanged.  IMPRESSION: Mild opacification of the right super hilar/paramediastinal region as cannot exclude infection. PA and lateral chest x-ray will be helpful for further evaluation.  Possible mild vascular congestion.  Cardiomegaly.   Electronically Signed   By: Marin Olp M.D.   On:  05/22/2013 00:18    Anti-infectives: Anti-infectives   Start     Dose/Rate Route Frequency Ordered Stop   05/22/13 2200  vancomycin (VANCOCIN) 1,500 mg in sodium chloride 0.9 % 500 mL IVPB     1,500 mg 250 mL/hr over 120 Minutes Intravenous Every 24 hours 05/21/13 2310     05/21/13 2330  piperacillin-tazobactam (ZOSYN) IVPB 3.375 g  Status:  Discontinued     3.375 g 12.5 mL/hr over 240 Minutes Intravenous 3 times per day 05/21/13 2307 05/22/13 1521   05/21/13 2315  vancomycin (VANCOCIN) 2,000 mg in sodium chloride 0.9 % 500 mL IVPB     2,000 mg 250 mL/hr over 120 Minutes Intravenous  Once 05/21/13 2310 05/22/13 0243   05/21/13 0721  ceFAZolin (ANCEF) 2-3 GM-% IVPB SOLR    Comments:  Precious Haws   : cabinet override      05/21/13 0721 05/21/13 1929   05/21/13 0000  ceFAZolin (ANCEF) IVPB 2 g/50 mL premix     2 g 100 mL/hr over 30 Minutes Intravenous  Once 05/20/13 1438 05/21/13 0838      Assessment/Plan: Left groin abscess S/p bedside I&D 05/22/13 Antibiotics per primary team BID wet to dry dressing changes.  She will need home health for dressing.   Follow up with [facility] arranged.   Surgery signing off, please call with questions or concerns.     LOS: 2 days    Rune Mendez ANP-BC Pager [phone] 05/23/2013 10:40 AM

## 2013-05-23 NOTE — Discharge Instructions (Signed)
MOUTH CARE AFTER SURGERY ° °FACTS: °· Ice used in ice bag helps keep the swelling down, and can help lessen the pain. °· It is easier to treat pain BEFORE it happens. °· Spitting disturbs the clot and may cause bleeding to start again, or to get worse. °· Smoking delays healing and can cause complications. °· Sharing prescriptions can be dangerous.  Do not take medications not recently prescribed for you. °· Antibiotics may stop birth control pills from working.  Use other means of birth control while on antibiotics. °· Warm salt water rinses after the first 24 hours will help lessen the swelling:  Use 1/2 teaspoonful of table salt per oz.of water. ° °DO NOT: °· Do not spit.  Do not drink through a straw. °· Strongly advised not to smoke, dip snuff or chew tobacco at least for 3 days. °· Do not eat sharp or crunchy foods.  Avoid the area of surgery when chewing. °· Do not stop your antibiotics before your instructions say to do so. °· Do not eat hot foods until bleeding has stopped.  If you need to, let your food cool down to room temperature. ° °EXPECT: °· Some swelling, especially first 2-3 days. °· Soreness or discomfort in varying degrees.  Follow your dentist's instructions about how to handle pain before it starts. °· Pinkish saliva or light blood in saliva, or on your pillow in the morning.  This can last around 24 hours. °· Bruising inside or outside the mouth.  This may not show up until 2-3 days after surgery.  Don't worry, it will go away in time. °· Pieces of "bone" may work themselves loose.  It's OK.  If they bother you, let us know. ° °WHAT TO DO IMMEDIATELY AFTER SURGERY: °· Bite on the gauze with steady pressure for 1-2 hours.  Don't chew on the gauze. °· Do not lie down flat.  Raise your head support especially for the first 24 hours. °· Apply ice to your face on the side of the surgery.  You may apply it 20 minutes on and a few minutes off.  Ice for 8-12 hours.  You may use ice up to 24  hours. °· Before the numbness wears off, take a pain pill as instructed. °· Prescription pain medication is not always required. ° °SWELLING: °· Expect swelling for the first couple of days.  It should get better after that. °· If swelling increases 3 days or so after surgery; let us know as soon as possible. ° °FEVER: °· Take Tylenol every 4 hours if needed to lower your temperature, especially if it is at 100F or higher. °· Drink lots of fluids. °· If the fever does not go away, let us know. ° °BREATHING TROUBLE: °· Any unusual difficulty breathing means you have to have someone bring you to the emergency room ASAP ° °BLEEDING: °· Light oozing is expected for 24 hours or so. °· Prop head up with pillows °· Avoid spitting °· Do not confuse bright red fresh flowing blood with lots of saliva colored with a little bit of blood. °· If you notice some bleeding, place gauze or a tea bag where it is bleeding and apply CONSTANT pressure by biting down for 1 hour.  Avoid talking during this time.  Do not remove the gauze or tea bag during this hour to "check" the bleeding. °· If you notice bright RED bleeding FLOWING out of particular area, and filling the floor of your mouth, put   a wad of gauze on that area, bite down firmly and constantly.  Call us immediately.  If we're closed, have someone bring you to the emergency room.  ORAL HYGIENE:  Brush your teeth as usual after meals and before bedtime.  Use a soft toothbrush around the area of surgery.  DO NOT AVOID BRUSHING.  Otherwise bacteria(germs) will grow and may delay healing or encourage infection.  Since you cannot spit, just gently rinse and let the water flow out of your mouth.  DO NOT SWISH HARD.  EATING:  Cool liquids are a good point to start.  Increase to soft foods as tolerated.  PRESCRIPTIONS:  Follow the directions for your prescriptions exactly as written.  If Dr. Enrique Sack gave you a narcotic pain medication, do not drive, operate  machinery or drink alcohol when on that medication.  QUESTIONS:  Call our office during office hours [phone] or call the Emergency Room at [phone].   WOUND CARE  It is important that the wound be kept open.   -Keeping the skin edges apart will allow the wound to gradually heal from the base upwards.   - If the skin edges of the wound close too early, a new fluid pocket can form and infection can occur. -This is the reason to pack deeper wounds with gauze or ribbon -This is why drained wounds cannot be sewed closed right away  A healthy wound should form a lining of bright red "beefy" granulating tissue that will help shrink the wound and help the edges grow new skin into it.   -A little mucus / yellow discharge is normal (the body's natural way to try and form a scab) and should be gently washed off with soap and water with daily dressing changes.  -Green or foul smelling drainage implies bacterial colonization and can slow wound healing - a short course of antibiotic ointment (3-5 days) can help it clear up.  Call the doctor if it does not improve or worsens  -Avoid use of antibiotic ointments for more than a week as they can slow wound healing over time.    -Sometimes other wound care products will be used to reduce need for dressing changes and/or help clean up dirty wounds -Sometimes the surgeon needs to debride the wound in the office to remove dead or infected tissue out of the wound so it can heal more quickly and safely.    Change the dressing at least once a day -Wash the wound with mild soap and water gently every day.  It is good to shower or bathe the wound to help it clean out. -Use clean 4x4 gauze for medium/large wounds or ribbon plain NU-gauze for smaller wounds (it does not need to be sterile, just clean) -Keep the raw wound moist with a little saline or KY (saline) gel on the gauze.  -A dry wound will take longer to heal.  -Keep the skin dry around the wound to  prevent breakdown and irritation. -Pack the wound down to the base -The goal is to keep the skin apart, not overpack the wound -Use a Q-tip or blunt-tipped kabob stick toothpick to push the gauze down to the base in narrow or deep wounds   -Cover with a clean gauze and tape -paper or Medipore tape tend to be gentle on the skin -rotate the orientation of the tape to avoid repeated stress/trauma on the skin -using an ACE or Coban wrap on wounds on arms or legs can be used instead.  Complete all antibiotics through the entire prescription to help the infection heal and prevent new places of infection   Returning the see the surgeon is helpful to follow the healing process and help the wound close as fast as possible.

## 2013-05-23 NOTE — Progress Notes (Signed)
[facility]    Subjective: No new complaints   Antibiotics:  Anti-infectives   Start     Dose/Rate Route Frequency Ordered Stop   05/22/13 2200  vancomycin (VANCOCIN) 1,500 mg in sodium chloride 0.9 % 500 mL IVPB     1,500 mg 250 mL/hr over 120 Minutes Intravenous Every 24 hours 05/21/13 2310     05/21/13 2330  piperacillin-tazobactam (ZOSYN) IVPB 3.375 g  Status:  Discontinued     3.375 g 12.5 mL/hr over 240 Minutes Intravenous 3 times per day 05/21/13 2307 05/22/13 1521   05/21/13 2315  vancomycin (VANCOCIN) 2,000 mg in sodium chloride 0.9 % 500 mL IVPB     2,000 mg 250 mL/hr over 120 Minutes Intravenous  Once 05/21/13 2310 05/22/13 0243   05/21/13 0721  ceFAZolin (ANCEF) 2-3 GM-% IVPB SOLR    Comments:  Precious Haws   : cabinet override      05/21/13 0721 05/21/13 1929   05/21/13 0000  ceFAZolin (ANCEF) IVPB 2 g/50 mL premix     2 g 100 mL/hr over 30 Minutes Intravenous  Once 05/20/13 1438 05/21/13 0838      Medications: Scheduled Meds: . atorvastatin  80 mg Oral q1800  . carvedilol  25 mg Oral BID WC  . insulin aspart  0-15 Units Subcutaneous TID WC  . insulin glargine  80 Units Subcutaneous BID  . irbesartan  150 mg Oral Daily  . omega-3 acid ethyl esters  4 g Oral Daily  . pantoprazole  40 mg Oral Daily  . pregabalin  150 mg Oral QPM  . vancomycin  1,500 mg Intravenous Q24H   Continuous Infusions: . lactated ringers [id number] application ([id number])   PRN Meds:.acetaminophen, albuterol, clonazePAM, cyclobenzaprine, fluticasone, HYDROcodone-acetaminophen, morphine injection, nitroGLYCERIN, oxyCODONE-acetaminophen    Objective: Weight change: 6 lb 13.4 oz (3.101 kg)  Intake/Output Summary (Last 24 hours) at 05/23/13 1458 Last data filed at 05/23/13 1325  Gross per 24 hour  Intake    340 ml  Output   2600 ml  Net  -2260 ml   Blood pressure 106/56, pulse 85, temperature 98.2 F (36.8 C), temperature source Oral, resp. rate  18, height 5\' 2"  (1.575 m), weight 228 lb 13.4 oz (103.8 kg), SpO2 91.00%. Temp:  [98.2 F (36.8 C)-100.7 F (38.2 C)] 98.2 F (36.8 C) (04/10 1322) Pulse Rate:  [85-104] 85 (04/10 1322) Resp:  [18-20] 18 (04/10 1322) BP: (96-127)/(56-78) 106/56 mmHg (04/10 1322) SpO2:  [91 %-100 %] 91 % (04/10 1322) Weight:  [228 lb 13.4 oz (103.8 kg)] 228 lb 13.4 oz (103.8 kg) (04/10 0528)  Physical Exam: General: Alert and awake, oriented x3, not in any acute distress.  HEENT: anicteric sclera,EOMI, oropharynx clear and without exudate, sp dental extractions  CVS regular rate, normal r, no murmur rubs or gallops  Chest: clear to auscultation bilaterally, no wheezing, rales or rhonchi  Abdomen: soft nontender, nondistended, normal bowel sounds,  Skin: left sided abscess area is packed Neuro: nonfocal, strength   CBC:  Recent Labs Lab 05/20/13 0925 05/22/13 1340 05/23/13 1230  HGB 11.8* 10.6* 9.6*  HCT 34.7* 32.5* 29.6*  PLT 253 253 218     BMET  Recent Labs  05/22/13 1340 05/23/13 1230  NA 135* 138  K 5.3 4.7  CL 97 102  CO2 24 22  GLUCOSE 228* 141*  BUN 39* 39*  CREATININE 2.01* 2.00*  CALCIUM 8.7 8.3*     Liver Panel  No results found  for this basename: PROT, ALBUMIN, AST, ALT, ALKPHOS, BILITOT, BILIDIR, IBILI,  in the last 72 hours     Sedimentation Rate No results found for this basename: ESRSEDRATE,  in the last 72 hours C-Reactive Protein No results found for this basename: CRP,  in the last 72 hours  Micro Results: Recent Results (from the past 240 hour(s))  CULTURE, BLOOD (ROUTINE X 2)     Status: None   Collection Time    05/22/13 12:31 AM      Result Value Ref Range Status   Specimen Description BLOOD RIGHT ARM   Final   Special Requests BOTTLES DRAWN AEROBIC AND ANAEROBIC 10CC   Final   Culture  Setup Time     Final   Value: 05/22/2013 08:27     Performed at [facility]   Culture     Final   Value:        BLOOD CULTURE RECEIVED NO GROWTH  TO DATE CULTURE WILL BE HELD FOR 5 DAYS BEFORE ISSUING A FINAL NEGATIVE REPORT     Performed at [facility]   Report Status PENDING   Incomplete  CULTURE, BLOOD (ROUTINE X 2)     Status: None   Collection Time    05/22/13 12:37 AM      Result Value Ref Range Status   Specimen Description BLOOD RIGHT FOREARM   Final   Special Requests BOTTLES DRAWN AEROBIC ONLY 10CC   Final   Culture  Setup Time     Final   Value: 05/22/2013 08:27     Performed at [facility]   Culture     Final   Value:        BLOOD CULTURE RECEIVED NO GROWTH TO DATE CULTURE WILL BE HELD FOR 5 DAYS BEFORE ISSUING A FINAL NEGATIVE REPORT     Performed at [facility]   Report Status PENDING   Incomplete  URINE CULTURE     Status: None   Collection Time    05/22/13  6:00 AM      Result Value Ref Range Status   Specimen Description URINE, RANDOM   Final   Special Requests NONE   Final   Culture  Setup Time     Final   Value: 05/22/2013 06:00     Performed at [facility]     Final   Value: NO GROWTH     Performed at [facility]   Culture     Final   Value: NO GROWTH     Performed at [facility]   Report Status 05/23/2013 FINAL   Final  CULTURE, ROUTINE-ABSCESS     Status: None   Collection Time    05/22/13  3:46 PM      Result Value Ref Range Status   Specimen Description ABSCESS LEFT GROIN   Final   Special Requests NONE   Final   Gram Stain     Final   Value: FEW WBC PRESENT,BOTH PMN AND MONONUCLEAR     NO SQUAMOUS EPITHELIAL CELLS SEEN     FEW GRAM POSITIVE COCCI     IN PAIRS     Performed at [facility]   Culture     Final   Value: Culture reincubated for better growth     Performed at [facility]   Report Status PENDING   Incomplete    Studies/Results: Dg Chest Port 1 View  05/22/2013  CLINICAL DATA:  Fever.  EXAM: PORTABLE CHEST - 1 VIEW  COMPARISON:  12/05/2012  FINDINGS: Lungs are hypoinflated with mild  prominence of the perihilar markings which may represent mild vascular congestion versus secondary to the degree of hypoinflation. There is mild opacification over the right suprahilar/paramediastinal region as cannot exclude infection. There is mild stable cardiomegaly. Remainder the exam is unchanged.  IMPRESSION: Mild opacification of the right super hilar/paramediastinal region as cannot exclude infection. PA and lateral chest x-ray will be helpful for further evaluation.  Possible mild vascular congestion.  Cardiomegaly.   Electronically Signed   By: Marin Olp M.D.   On: 05/22/2013 00:18      Assessment/Plan:  Active Problems:   Non-ischemic cardiomyopathy   Mitral valve disease   Dental caries - s/p Multiple extraction of tooth numbers 12, 19, and 30 - 05/21/13   Abscess of groin, left -s/p I&D 05/22/13    Audrey Olson is a 53 y.o. female with Post-operative fevers that were due to a pre-existing groing abscess   #1 Groin abscess:   Greatly appreciate [facility] performing I and D for culture   Statistically this is most likely to be due to skin flora such as MRSA, MSSA, GAS, GBS. Culture which is being reported as being reincubated sounds like is going to be a strep but there is still a chance for MRSA < MSSA or MR Coag NEG STaph  --she DOES NOT NEED TO GO HOME WITH IV VANCOMYCIN --if there was urgency to sending her home before the final cultures were available I would send her home  On  KEFLEX 500MG  PO EVERY 8 HOURS AND  doxycyline 100mg  bid AND i WILLL CHANGE HER TO THAT REGIMEN TODAY   --if we can know the culture results first we can give a more narrow oral recommendation  -- I would give her 2 weeks of abx post I and D  --we will arrange HSFU in our Clinic   #2Screening: check HIV and hepatitis panel are negative  Dr. Baxter Flattery will be covering this weekend and is available for questions.     LOS: 2 days   Truman Hayward 05/23/2013, 2:58 PM

## 2013-05-23 NOTE — Progress Notes (Signed)
   CARE MANAGEMENT ED NOTE 05/23/2013  Patient:  [facility]   Account Number:  [id number]  Date Initiated:  05/23/2013  Documentation initiated by:  Livia Snellen  Subjective/Objective Assessment:     Subjective/Objective Assessment Detail:     Action/Plan:   Action/Plan Detail:   Anticipated DC Date:  05/23/2013     Status Recommendation to Physician:   Result of Recommendation:      [place]  CM consult  Other    Choice offered to / List presented to:       [facility] arranged  HH-1 RN      [place] .    Status of service:  Completed, signed off  ED Comments:   ED Comments Detail:  [facility] consulted by Forestine Na on call CM for assistance faxing home health orders to [facility].  As per Tammy CM, has already spoke to [facility] and has spoken to provider to place home health orders in and placed face to face.  EDCM noted patient has Medicaid insurance and pcp as Gl[place]on Axe. [facility] faxed home health orders, face to face, H&P, surgical note, and face sheet to [facility] at 1750pm with confirmation of receipt at 1756pm.  No further EDCM needs at this time.

## 2013-05-23 NOTE — Progress Notes (Signed)
Agree with above.  Audrey Olson. Dahlia Bailiff, MD, [place] [phone] [facility]

## 2013-05-23 NOTE — Discharge Summary (Signed)
Discharge Summary   Patient ID: Audrey Olson MRN: [id number], DOB/AGE: 1960/03/20 53 y.o. Admit date: 05/21/2013 D/C date:     05/23/2013  Primary Cardiologist: Dr. Rayann Heman  Principal Problem:   Abscess of groin, left -s/p I&D 05/22/13 Active Problems:   Non-ischemic cardiomyopathy   Type II diabetes mellitus   Chronic kidney disease   Tobacco abuse   COPD (chronic obstructive pulmonary disease)   Obstructive sleep apnea   Hypertension   Hypercholesterolemia   Mitral valve disease   Dental caries - s/p Multiple extraction of tooth numbers 12, 19, and 30 - 05/21/13   Morbid obesity-Body mass index is 41.84 kg/(m^2).   Admission Dates: 05/21/13 - 05/23/13  Discharge Diagnosis: Pre-existing left groin abcess while inpatient for multiple tooth extractions for future MVR and/or ICD implantation     HPI: Audrey Olson is a 53 y.o. female with a history of NICM (EF of 30%), NYHA Class III CHF, and mitral regurgitation who presented on 05/21/13 for multiple tooth extractions, performed by Dr. Enrique Sack. This was done given the possibility of future MVR and/or ICD implantation. She had seen Dr Roxy Manns for evaluation of MR and possible MVR. At that time she was referred to a dental surgeon due to poor dentition. Interestingly, a repeat 2D echo on 04/15/13 revealed only trivial MR with EF 25%, which is not likely to be surgical. If she does not require valvular repair (seems unlikely given recent echo), then she will undergo ICD implantation with Dr. Rayann Heman. She was encouraged her to follow-up with dental surgery before ICD placement.  Hospital Course: Cardiology was asked to admit for post-op overnight observation. On POD #1, she was noted to have leukocytosis and persistent fevers with Tmax of 104. CXR was done which demonstrated some minimal opacity in right hilar region. UA 3-6 wbc. Blood cultures were obtained taken and pt was started on vanc and zosyn. It was then subsequently discovered that patient had a  pre-existing left sided groin abscess. Both General Surgery and ID were consulted. She underwent bedside I&D of the abscess. Per report, an area of 3x4x3cm was opened. Moderate purulent drainage was present and a culture was obtained (results pending). ID discontinued zosyn and recommended continuation of Vancomycin.   Multiple Dental Extractions: POD#2. Pt was seen by Dr. Enrique Sack today. He feels she is stable from a dental standpoint. He has recommended salt water rinses Q2H while awake. Advanced diet as tolerated. F/U appt has been arranged for 06/02/13.   Left Groin Abscess: Day 1 s/p I&D by General Surgery. They have recommended BID wet to dry dressing changes with Home Health services be arranged for dressing changes. These orders have been put in and paperwork faxed. Per ID: statistically this is most likely to be due to skin flora such as MRSA, MSSA, GAS, or GBS. Culture which is being reported as being reincubated sounds like is going to be a strep but there is still a chance for MRSA < MSSA or MR Coag NEG Staph. It was not felt that she needed to go home on IV Vancomycin. They recommended that she go home on Keflex 500MG  PO q8 hrs and doxycyline 100mg  bid for 2 weeks of abx post I&D. They stated that they would arrange HSFU in their clinic; however, this was not arranged by the time of discharge. ( It is after normal business hours and a follow up appointment cannot be made. A number for the patient to call has been provided so that she can  make her own follow up appointment.) A follow up with surgery for a wound check has been arranged for 06/17/13.   Mitral Regurgitation: Recent echo suggests that her MR is not likely to be surgical. She has scheduled follow-up with Dr Roxy Manns.   Nonischemic CM (EF 30%), NYHA Class III CHF: If she does not require valvular repair (seems unlikely given recent echo), then we will proceed with ICD implantation. We may proceed now that she has undergone dental extractions,  but we will need to insure her that she recovers from infection from groin abscess beforehand. She will f/u with Melina Copa PA-C on a day that Dr. Rayann Heman is in the office.   The patient has had a complicated hospital course, but is now recovering well. She has been seen by Dr. Gwenlyn Found today and deemed stable for discharge home. All follow-up appointments have been arranged. Discharge medications include Keflex 500MG  PO q8 hrs and doxycyline 100mg  bid for 2 weeks of abx post I&D, aspirin, carvedilol, rosuvastatin, and valsartan.    Discharge Vitals: Blood pressure 106/56, pulse 85, temperature 98.2 F (36.8 C), temperature source Oral, resp. rate 18, height 5\' 2"  (1.575 m), weight 228 lb 13.4 oz (103.8 kg), SpO2 91.00%.  Labs: Lab Results  Component Value Date   WBC 11.5* 05/23/2013   HGB 9.6* 05/23/2013   HCT 29.6* 05/23/2013   MCV 94.9 05/23/2013   PLT 218 05/23/2013    Recent Labs Lab 05/20/13 0925  05/23/13 1230  NA 136*  < > 138  K 4.8  < > 4.7  CL 95*  < > 102  CO2 24  < > 22  BUN 37*  < > 39*  CREATININE 1.31*  < > 2.00*  CALCIUM 9.5  < > 8.3*  PROT 6.8  --   --   BILITOT 0.2*  --   --   ALKPHOS 123*  --   --   ALT 14  --   --   AST 9  --   --   GLUCOSE 406*  < > 141*  < > = values in this interval not displayed.   Diagnostic Studies/Procedures   Dg Chest Port 1 View  05/22/2013   CLINICAL DATA:  Fever.  EXAM: PORTABLE CHEST - 1 VIEW  COMPARISON:  12/05/2012  FINDINGS: Lungs are hypoinflated with mild prominence of the perihilar markings which may represent mild vascular congestion versus secondary to the degree of hypoinflation. There is mild opacification over the right suprahilar/paramediastinal region as cannot exclude infection. There is mild stable cardiomegaly. Remainder the exam is unchanged.  IMPRESSION: Mild opacification of the right super hilar/paramediastinal region as cannot exclude infection. PA and lateral chest x-ray will be helpful for further evaluation.   Possible mild vascular congestion.  Cardiomegaly.      Discharge Medications     Medication List         albuterol 108 (90 BASE) MCG/ACT inhaler  Commonly known as:  PROVENTIL HFA;VENTOLIN HFA  Inhale 2 puffs into the lungs every 6 (six) hours as needed for wheezing or shortness of breath.     aspirin EC 81 MG tablet  Take 81 mg by mouth daily.     carvedilol 25 MG tablet  Commonly known as:  COREG  Take 25 mg by mouth 2 (two) times daily with a meal.     cephALEXin 500 MG capsule  Commonly known as:  KEFLEX  Take 1 capsule (500 mg total) by mouth 3 (three) times daily.  clonazePAM 1 MG tablet  Commonly known as:  KLONOPIN  Take 1 mg by mouth at bedtime as needed for anxiety.     cyclobenzaprine 10 MG tablet  Commonly known as:  FLEXERIL  Take 10 mg by mouth 3 (three) times daily as needed for muscle spasms.     doxycycline 100 MG tablet  Commonly known as:  VIBRA-TABS  Take 1 tablet (100 mg total) by mouth 2 (two) times daily.     fluticasone 50 MCG/ACT nasal spray  Commonly known as:  FLONASE  Place 2 sprays into both nostrils at bedtime as needed for allergies.     HYDROcodone-acetaminophen 10-325 MG per tablet  Commonly known as:  NORCO  Take 1 tablet by mouth every 6 (six) hours as needed for moderate pain.     insulin glargine 100 UNIT/ML injection  Commonly known as:  LANTUS  Inject 80 Units into the skin 2 (two) times daily.     Magnesium Oxide 250 MG Tabs  Take 1 tablet by mouth daily.     metFORMIN 1000 MG tablet  Commonly known as:  GLUCOPHAGE  Take 1,000 mg by mouth 2 (two) times daily with a meal.     nitroGLYCERIN 0.4 MG SL tablet  Commonly known as:  NITROSTAT  Place 0.4 mg under the tongue every 5 (five) minutes as needed for chest pain.     NOVOLOG FLEXPEN 100 UNIT/ML FlexPen  Generic drug:  insulin aspart  Inject 6-36 Units into the skin 3 (three) times daily with meals. Sliding scale     omega-3 acid ethyl esters 1 G capsule    Commonly known as:  LOVAZA  Take 4 g by mouth daily.     omeprazole 40 MG capsule  Commonly known as:  PRILOSEC  Take 40 mg by mouth daily.     pregabalin 150 MG capsule  Commonly known as:  LYRICA  Take 150 mg by mouth every evening.     rosuvastatin 40 MG tablet  Commonly known as:  CRESTOR  Take 40 mg by mouth daily.     valsartan 160 MG tablet  Commonly known as:  DIOVAN  Take 160 mg by mouth daily.     Vitamin D (Ergocalciferol) 50000 UNITS Caps capsule  Commonly known as:  DRISDOL  Take 50,000 Units by mouth every 7 (seven) days. Tuesdays        Disposition   The patient will be discharged in stable condition to home. Discharge Orders   Future Appointments Provider Department Dept Phone   06/12/2013 1:45 PM Felipa Evener [place] [facility] [phone]   06/17/2013 1:30 PM [facility] [place] [phone]   Future Orders Complete By Expires   Consult to dietitian  As directed    Scheduling Instructions:   Patient is edentulous.   Face-to-face encounter (required for Medicare/Medicaid patients)  As directed    Questions:     The encounter with the patient was in whole, or in part, for the following medical condition, which is the primary reason for home health care:  left groin abcess   I certify that, based on my findings, the following services are medically necessary home health services:  Nursing   My clinical findings support the need for the above services:  OTHER SEE COMMENTS   Further, I certify that my clinical findings support that this patient is homebound due to:  Open/draining pressure/stasis ulcer   Reason for  Medically Necessary Home Health Services:  Skilled Nursing- Complex Wound Care   Gauze  As directed    Home Health  As directed    Scheduling Instructions:   Needs dressing changes for left groin abscess.   Questions:     To provide the following care/treatments:  RN     Follow-up  Information   Follow up with [name], DDS On 06/02/2013. (For suture removal)    Specialty:  Dentistry   Contact information:   [place] [address] [phone]       Follow up with [place] On 06/17/2013. (arrive by 1pm for a 1:30pm appt for a wound check)    Contact information:   [place]   [place] [address] [phone]       Follow up with Melina Copa, PA-C On 06/12/2013. (@ 1:45pm)    Specialty:  Cardiology   Contact information:   [address] [place] [address] [phone]       Follow up with Alcide Evener, MD On 06/06/2013. (Please make an appointment with someone in this office to follow up. You should see them in two weeks)    Specialty:  Infectious Diseases   Contact information:   [address] [place] [address] [phone]         Duration of Discharge Encounter: Greater than 30 minutes including physician and PA time.  Signed, Perry Mount PA-C 05/23/2013, 7:48 PM

## 2013-05-23 NOTE — Progress Notes (Signed)
Patient Profile: 53 y/o female, followed by Dr. Rayann Heman, with a h/o NICM (EF of 30%), chronic syst CHF, mitral regurgitation (trivial on echo 04/2013) who presented on 05/21/13 for multiple tooth extractions, performed by Dr. Enrique Sack. This was done given the possibility of future MVR and/or ICD implantation. We were asked to admit for post-op overnight observation. On POD#1, she was noted to have leukocytosis and persistent fevers with Tmax of 104. CXR was  done which demonstrated some minimal opacity in right hilar region . UA 3-6 wbc. Blood cultures were obtained taken and pt was started on vanc and zosyn. It was then subsequently discovered that patient had a left sided groin abscess. Both General Surgery and ID were consulted. She underwent bedside I&D of the abscess. Per report, an area of 3x4x3cm was opened. Moderate purulent drainage was present and a culture was obtained (results pending). ID discontinued zosyn and recommended continuation of Vancomycin.   At last vitals check today at 0528, she remained febrile with a temp of 100.0. Pulse was 104. WBC pending.   Subjective: She feels much better today now that abscess was drained. She denies pain. No SOB.   Objective: Vital signs in last 24 hours: Temp:  [98.5 F (36.9 C)-103.1 F (39.5 C)] 100.7 F (38.2 C) (04/10 0528) Pulse Rate:  [90-109] 104 (04/10 0528) Resp:  [18-20] 20 (04/10 0528) BP: (96-142)/(59-78) 125/78 mmHg (04/10 0847) SpO2:  [90 %-100 %] 95 % (04/10 0528) Weight:  [228 lb 13.4 oz (103.8 kg)] 228 lb 13.4 oz (103.8 kg) (04/10 0528) Last BM Date: 05/20/13  Intake/Output from previous day: 04/09 0701 - 04/10 0700 In: 340 [P.O.:340] Out: 1500 [Urine:1500] Intake/Output this shift: Total I/O In: -  Out: 800 [Urine:800]  Medications Current Facility-Administered Medications  Medication Dose Route Frequency Provider Last Rate Last Dose  . acetaminophen (TYLENOL) tablet 650 mg  650 mg Oral Q6H PRN Theressa Stamps, MD    650 mg at 05/22/13 1402  . albuterol (PROVENTIL) (2.5 MG/3ML) 0.083% nebulizer solution 2.5 mg  2.5 mg Nebulization Q6H PRN Troy Sine, MD      . atorvastatin (LIPITOR) tablet 80 mg  80 mg Oral q1800 Rogelia Mire, NP   80 mg at 05/22/13 1744  . carvedilol (COREG) tablet 25 mg  25 mg Oral BID WC Rogelia Mire, NP   25 mg at 05/23/13 0848  . clonazePAM (KLONOPIN) tablet 1 mg  1 mg Oral QHS PRN Rogelia Mire, NP      . cyclobenzaprine (FLEXERIL) tablet 10 mg  10 mg Oral TID PRN Rogelia Mire, NP      . fluticasone (FLONASE) 50 MCG/ACT nasal spray 2 spray  2 spray Each Nare QHS PRN Rogelia Mire, NP      . HYDROcodone-acetaminophen (NORCO) 10-325 MG per tablet 1 tablet  1 tablet Oral Q6H PRN Rogelia Mire, NP   1 tablet at 05/22/13 1957  . insulin aspart (novoLOG) injection 0-15 Units  0-15 Units Subcutaneous TID WC Rogelia Mire, NP   2 Units at 05/22/13 1745  . insulin glargine (LANTUS) injection 80 Units  80 Units Subcutaneous BID Rogelia Mire, NP   80 Units at 05/23/13 1012  . irbesartan (AVAPRO) tablet 150 mg  150 mg Oral Daily Rogelia Mire, NP   150 mg at 05/23/13 1012  . lactated ringers infusion   Intravenous Continuous [name], DDS 20 mL/hr at [id number] application at [id number] 1700  . morphine  2 MG/ML injection 2-4 mg  2-4 mg Intravenous Q2H PRN [name], DDS   2 mg at 05/22/13 1609  . nitroGLYCERIN (NITROSTAT) SL tablet 0.4 mg  0.4 mg Sublingual Q5 min PRN Rogelia Mire, NP      . omega-3 acid ethyl esters (LOVAZA) capsule 4 g  4 g Oral Daily Rogelia Mire, NP   4 g at 05/23/13 1012  . oxyCODONE-acetaminophen (PERCOCET/ROXICET) 5-325 MG per tablet 1-2 tablet  1-2 tablet Oral Q4H PRN [name], DDS   2 tablet at 05/23/13 0850  . pantoprazole (PROTONIX) EC tablet 40 mg  40 mg Oral Daily Rogelia Mire, NP   40 mg at 05/23/13 1012  . pregabalin (LYRICA) capsule 150 mg  150 mg Oral  QPM Rogelia Mire, NP   150 mg at 05/22/13 1744  . vancomycin (VANCOCIN) 1,500 mg in sodium chloride 0.9 % 500 mL IVPB  1,500 mg Intravenous Q24H Troy Sine, MD   1,500 mg at 05/22/13 2208    PE: General appearance: alert, cooperative and no distress Lungs: clear to auscultation bilaterally Heart: regular rate and rhythm Extremities: no LEE Pulses: 2+ and symmetric Skin: warm and dry Neurologic: Grossly normal  Lab Results:   Recent Labs  05/22/13 1340  WBC 12.4*  HGB 10.6*  HCT 32.5*  PLT 253   BMET  Recent Labs  05/22/13 1340  NA 135*  K 5.3  CL 97  CO2 24  GLUCOSE 228*  BUN 39*  CREATININE 2.01*  CALCIUM 8.7     Assessment/Plan  Active Problems:   Non-ischemic cardiomyopathy   Mitral valve disease   Dental caries - s/p Multiple extraction of tooth numbers 12, 19, and 30 - 05/21/13   Abscess of groin, left -s/p I&D 05/22/13  1. Multiple Dental Extractions: POD#2. Pt was seen by Dr. Enrique Sack today. He feels she is stable from a dental standpoint. He has recommended salt water rinses Q2H while awake. Advanced diet as tolerated. F/U appt has been arranged for 06/02/13.   2. Left Groin Abscess:  Day 1 s/p I&D by General Surgery. They have recommended BID wet to dry dressing changes. They recommend that Home Health be arranged for dressing changes. Will need to continue antibiotics. She is currently on Vanc per ID. ? Duration of Vanc. Will have ID re-evaluate today. If she can be transitioned to oral today, then she can possibly go home. If not, then will need to keep inpatient for IV antibiotics. F/u with surgery has been arranged for 06/17/13.  3. Mitral Regurgitation: Recent echo suggests that her MR is not likely to be surgical. She has scheduled follow-up with Dr Roxy Manns.  4. Nonischemic CM (EF 30%), NYHA Class III CHF:  If she does not require valvular repair (seems unlikely given recent echo), then we will proceed with ICD implantation. We may proceed now that  she has undergo dental extractions, but will need to insure her that she recovers from infection from groin abscess before hand. She will need to f/u with Dr. Rayann Heman.  Dispo:  Home once cleared by ID.  MD to follow with further recommendations.    LOS: 2 days    Brittainy M. Ladoris Gene 05/23/2013 11:13 AM  Agree with note written by Ellen Henri  [facility]  OK for D/C home with ID recommendations for 2 weeks of PO ATBX. F/U with them as well as [facility]  Lorretta Harp 05/23/2013 4:10 PM

## 2013-05-25 LAB — CULTURE, ROUTINE-ABSCESS

## 2013-05-26 ENCOUNTER — Telehealth: Payer: Self-pay | Admitting: Infectious Disease

## 2013-05-26 NOTE — Telephone Encounter (Signed)
Ok I will forward to [employer] so she can schedule her for a f/u

## 2013-05-26 NOTE — Telephone Encounter (Signed)
Pt grew MRSA from groin abscess. Doesn't need her keflex  Asked her to dc it and continue the doxy  She is to fu in [facility]

## 2013-05-28 LAB — CULTURE, BLOOD (ROUTINE X 2)
CULTURE: NO GROWTH
Culture: NO GROWTH

## 2013-06-02 ENCOUNTER — Encounter (HOSPITAL_COMMUNITY): Payer: Self-pay | Admitting: Dentistry

## 2013-06-02 ENCOUNTER — Ambulatory Visit (HOSPITAL_COMMUNITY): Payer: Medicaid - Dental | Admitting: Dentistry

## 2013-06-02 VITALS — BP 121/75 | HR 98 | Temp 98.0°F

## 2013-06-02 DIAGNOSIS — K08409 Partial loss of teeth, unspecified cause, unspecified class: Secondary | ICD-10-CM

## 2013-06-02 DIAGNOSIS — K08109 Complete loss of teeth, unspecified cause, unspecified class: Secondary | ICD-10-CM

## 2013-06-02 DIAGNOSIS — T8189XA Other complications of procedures, not elsewhere classified, initial encounter: Secondary | ICD-10-CM

## 2013-06-02 DIAGNOSIS — K08199 Complete loss of teeth due to other specified cause, unspecified class: Secondary | ICD-10-CM

## 2013-06-02 DIAGNOSIS — I059 Rheumatic mitral valve disease, unspecified: Secondary | ICD-10-CM

## 2013-06-02 MED ORDER — CHLORHEXIDINE GLUCONATE 0.12 % MT SOLN: OROMUCOSAL | Status: DC

## 2013-06-02 NOTE — Progress Notes (Signed)
POST OPERATIVE NOTE:  06/02/2013 Audrey Olson [id number]  VITALS: BP 121/75  Pulse 98  Temp(Src) 98 F (36.7 C) (Oral)   LABS:  Lab Results  Component Value Date   WBC 11.5* 05/23/2013   HGB 9.6* 05/23/2013   HCT 29.6* 05/23/2013   MCV 94.9 05/23/2013   PLT 218 05/23/2013   BMET    Component Value Date/Time   NA 138 05/23/2013 1230   K 4.7 05/23/2013 1230   CL 102 05/23/2013 1230   CO2 22 05/23/2013 1230   GLUCOSE 141* 05/23/2013 1230   BUN 39* 05/23/2013 1230   CREATININE 2.00* 05/23/2013 1230   CALCIUM 8.3* 05/23/2013 1230   GFRNONAA 28* 05/23/2013 1230   GFRAA 32* 05/23/2013 1230    No results found for this basename: INR, PROTIME   No results found for this basename: PTT     Audrey Olson is status post multiple extractions with alveoloplasty and gross debridement of remaining dentition on 05/21/2013 in the operating room. Patient was kept overnight for observation for H/O sleep apnea when elevated white count was noted. Patient subsequently found to have a groin abscess. Patient then had an incision drainage procedure performed by surgery team. Patient was then discharged on antibiotic therapy and pain medication. The patient now presents for periodic oral examination and evaluation for suture removal after dental extractions.  SUBJECTIVE: Patient currently is denying any significant oral discomfort. There may be a few stitches that remain. Patient has been using salt water rinses as needed. Patient indicates that she feels much better with her teeth out.  EXAM: There is no sign of infection, heme, or ooze. No sutures remain. Patient is healing in by secondary intention. There is some delayed healing from the extraction sites noted. There is no evidence superiorly is involving the extraction sites, however.  ASSESSMENT: Post operative course is consistent with dental procedures performed in the operating room on 05/21/2013. Delayed healing is noted from the extraction  sites in area #12, 19, and 30.  PLAN: 1. Prescription was given for chlorhexidine rinse. Patient is to rinse with 15 ML twice daily after breakfast and at bedtime. Prescription was sent to [facility] pharmacy. 6 refills provided.  2. Continue salt water rinses in between chlorhexidine rinses as needed to aid healing. 3. Brush teeth after meals and at bedtime. Brush tongue daily. 4. Return to clinic on 06/09/2013 for reevaluation of healing of the extraction site prior to seeing Dr. Rayann Heman for evaluation for pacemaker placement on 06/02/2013.  5. Follow up with the primary dentist of her choice for fabrication of partial dentures as indicated after adequate healing.    [name], DDS

## 2013-06-02 NOTE — Patient Instructions (Addendum)
PLAN: 1. Prescription was given for chlorhexidine rinse. Patient is to rinse with 15 ML twice daily after breakfast and at bedtime. Prescription was sent to [facility] pharmacy. 6 refills provided.  2. Continue salt water rinses in between chlorhexidine rinses as needed to aid healing. 3. Brush teeth after meals and at bedtime. Brush tongue daily. 4. Return to clinic on 06/09/2013 for reevaluation of healing of the extraction site prior to seeing Dr. Rayann Heman for evaluation for pacemaker placement on 06/02/2013.  5. Follow up with the primary dentist of her choice for fabrication of partial dentures as indicated after adequate healing.  Dr. Enrique Sack  MOUTH CARE AFTER SURGERY  FACTS:  Ice used in ice bag helps keep the swelling down, and can help lessen the pain.  It is easier to treat pain BEFORE it happens.  Spitting disturbs the clot and may cause bleeding to start again, or to get worse.  Smoking delays healing and can cause complications.  Sharing prescriptions can be dangerous.  Do not take medications not recently prescribed for you.  Antibiotics may stop birth control pills from working.  Use other means of birth control while on antibiotics.  Warm salt water rinses after the first 24 hours will help lessen the swelling:  Use 1/2 teaspoonful of table salt per oz.of water.  DO NOT:  Do not spit.  Do not drink through a straw.  Strongly advised not to smoke, dip snuff or chew tobacco at least for 3 days.  Do not eat sharp or crunchy foods.  Avoid the area of surgery when chewing.  Do not stop your antibiotics before your instructions say to do so.  Do not eat hot foods until bleeding has stopped.  If you need to, let your food cool down to room temperature.  EXPECT:  Some swelling, especially first 2-3 days.  Soreness or discomfort in varying degrees.  Follow your dentist's instructions about how to handle pain before it starts.  Pinkish saliva or light blood in  saliva, or on your pillow in the morning.  This can last around 24 hours.  Bruising inside or outside the mouth.  This may not show up until 2-3 days after surgery.  Don't worry, it will go away in time.  Pieces of "bone" may work themselves loose.  It's OK.  If they bother you, let us know.  WHAT TO DO IMMEDIATELY AFTER SURGERY:  Bite on the gauze with steady pressure for 1-2 hours.  Don't chew on the gauze.  Do not lie down flat.  Raise your head support especially for the first 24 hours.  Apply ice to your face on the side of the surgery.  You may apply it 20 minutes on and a few minutes off.  Ice for 8-12 hours.  You may use ice up to 24 hours.  Before the numbness wears off, take a pain pill as instructed.  Prescription pain medication is not always required.  SWELLING:  Expect swelling for the first couple of days.  It should get better after that.  If swelling increases 3 days or so after surgery; let us know as soon as possible.  FEVER:  Take Tylenol every 4 hours if needed to lower your temperature, especially if it is at 100F or higher.  Drink lots of fluids.  If the fever does not go away, let us know.  BREATHING TROUBLE:  Any unusual difficulty breathing means you have to have someone bring you to the emergency room ASAP  BLEEDING:  Light oozing is expected for 24 hours or so.  Prop head up with pillows  Avoid spitting  Do not confuse bright red fresh flowing blood with lots of saliva colored with a little bit of blood.  If you notice some bleeding, place gauze or a tea bag where it is bleeding and apply CONSTANT pressure by biting down for 1 hour.  Avoid talking during this time.  Do not remove the gauze or tea bag during this hour to "check" the bleeding.  If you notice bright RED bleeding FLOWING out of particular area, and filling the floor of your mouth, put a wad of gauze on that area, bite down firmly and constantly.  Call us immediately.  If we're  closed, have someone bring you to the emergency room.  ORAL HYGIENE:  Brush your teeth as usual after meals and before bedtime.  Use a soft toothbrush around the area of surgery.  DO NOT AVOID BRUSHING.  Otherwise bacteria(germs) will grow and may delay healing or encourage infection.  Since you cannot spit, just gently rinse and let the water flow out of your mouth.  DO NOT SWISH HARD.  EATING:  Cool liquids are a good point to start.  Increase to soft foods as tolerated.  PRESCRIPTIONS:  Follow the directions for your prescriptions exactly as written.  If Dr. Enrique Sack gave you a narcotic pain medication, do not drive, operate machinery or drink alcohol when on that medication.  QUESTIONS:  Call our office during office hours [phone] or call the Emergency Room at [phone].

## 2013-06-09 ENCOUNTER — Ambulatory Visit (HOSPITAL_COMMUNITY): Payer: Self-pay | Admitting: Dentistry

## 2013-06-10 ENCOUNTER — Ambulatory Visit (HOSPITAL_COMMUNITY): Payer: Self-pay | Admitting: Dentistry

## 2013-06-10 ENCOUNTER — Inpatient Hospital Stay: Payer: Self-pay | Admitting: Internal Medicine

## 2013-06-12 ENCOUNTER — Encounter: Payer: Self-pay | Admitting: Physician Assistant

## 2013-06-12 ENCOUNTER — Ambulatory Visit (INDEPENDENT_AMBULATORY_CARE_PROVIDER_SITE_OTHER): Payer: Medicaid Other | Admitting: Internal Medicine

## 2013-06-12 VITALS — BP 130/70 | HR 97 | Ht 63.0 in | Wt 223.0 lb

## 2013-06-12 DIAGNOSIS — A4902 Methicillin resistant Staphylococcus aureus infection, unspecified site: Secondary | ICD-10-CM

## 2013-06-12 DIAGNOSIS — I340 Nonrheumatic mitral (valve) insufficiency: Secondary | ICD-10-CM

## 2013-06-12 DIAGNOSIS — D649 Anemia, unspecified: Secondary | ICD-10-CM

## 2013-06-12 DIAGNOSIS — I059 Rheumatic mitral valve disease, unspecified: Secondary | ICD-10-CM

## 2013-06-12 DIAGNOSIS — I428 Other cardiomyopathies: Secondary | ICD-10-CM

## 2013-06-12 DIAGNOSIS — N289 Disorder of kidney and ureter, unspecified: Secondary | ICD-10-CM

## 2013-06-12 DIAGNOSIS — I10 Essential (primary) hypertension: Secondary | ICD-10-CM

## 2013-06-12 LAB — CBC WITH DIFFERENTIAL/PLATELET
BASOS ABS: 0 10*3/uL (ref 0.0–0.1)
BASOS PCT: 0.5 % (ref 0.0–3.0)
EOS ABS: 0.2 10*3/uL (ref 0.0–0.7)
Eosinophils Relative: 1.8 % (ref 0.0–5.0)
HEMATOCRIT: 34.1 % — AB (ref 36.0–46.0)
Hemoglobin: 11.4 g/dL — ABNORMAL LOW (ref 12.0–15.0)
LYMPHS PCT: 28.8 % (ref 12.0–46.0)
Lymphs Abs: 2.6 10*3/uL (ref 0.7–4.0)
MCHC: 33.3 g/dL (ref 30.0–36.0)
MCV: 91.9 fl (ref 78.0–100.0)
MONOS PCT: 5.3 % (ref 3.0–12.0)
Monocytes Absolute: 0.5 10*3/uL (ref 0.1–1.0)
NEUTROS ABS: 5.7 10*3/uL (ref 1.4–7.7)
Neutrophils Relative %: 63.6 % (ref 43.0–77.0)
Platelets: 230 10*3/uL (ref 150.0–400.0)
RBC: 3.71 Mil/uL — AB (ref 3.87–5.11)
RDW: 15.1 % — AB (ref 11.5–14.6)
WBC: 8.9 10*3/uL (ref 4.5–10.5)

## 2013-06-12 LAB — BASIC METABOLIC PANEL
BUN: 35 mg/dL — AB (ref 6–23)
CHLORIDE: 105 meq/L (ref 96–112)
CO2: 28 meq/L (ref 19–32)
Calcium: 9.3 mg/dL (ref 8.4–10.5)
Creatinine, Ser: 1.4 mg/dL — ABNORMAL HIGH (ref 0.4–1.2)
GFR: 43.88 mL/min — ABNORMAL LOW (ref >60.00)
Glucose, Bld: 167 mg/dL — ABNORMAL HIGH (ref 70–99)
Potassium: 5.3 mEq/L — ABNORMAL HIGH (ref 3.5–5.1)
Sodium: 138 mEq/L (ref 135–145)

## 2013-06-12 NOTE — Patient Instructions (Signed)
Your physician recommends that you have lab work today: bmet/cbc   Your physician recommends that you keep your scheduled  follow-up appointment with Dr. Rayann Heman on  July 29th @ 10:00 am   Your physician recommends that you schedule a follow-up appointment with Dr. Claudie Leach in a few weeks

## 2013-06-12 NOTE — Progress Notes (Signed)
[place] [place], [place]  [address] Phone: [phone] Fax:  [phone]  Date:  06/15/2013   Patient ID:  Audrey Olson, DOB 1960-02-26, MRN [id number]   PCP:  Glendon Axe, MD  Cardiologist:  Orlena Sheldon)  Electrophysiologist:  Jeanann Balinski  History of Present Illness: Audrey Olson is a 53 y.o. female with history of NICM, chronic systolic CHF, mitral regurgitation who presents today for post-hospital followup following multiple dental extractions and a L groin abscess.  She has a h/o NICM with an EF of 25-30% s/p catheterization in [place] in 11/2012, reported to show normal coronaries. TEE in 02/2013 showed depressed EF and mod-sev MR and she was referred to Dr. Rayann Heman for consideration of ICD placement. He felt that surgical consult was warranted in the setting of mod-sev MR and she saw Dr. Roxy Manns in 03/2013. CTA of the chest/abd/pelvis was performed after this visit showing a nonocllusive partially calcified plaque throughout the infrarenal abd aorta and bilat common ililac arteries but no significant acute findings. She was also referred to dental surgery but initially failed to f/u as planned. Echo was repeated in early March and this time showed only trivial MR.  She underwent multiple teeth extractions on 05/21/13 and cardiology was asked to admit for overnight obs. She was noted to be febrile with leukocytosis and underwent I&D of a pre-existing but worsening groin abscess that later grew MRSA. ID was involved in her care and she completed a course of doxycycline with plans to follow up with them as an outpatient. She has had difficulty obtaining her Coreg due to issues with Medicaid therefore was planning to pick up her RX when she leaves here. She otherwise feels well without any complaints. The groin abscess is improving. No CP, SOB, LEE, syncope. Of note, the patient's Cr trended from 1.31 to 2.0 during hospital admission and Hgb from 13.6 down to 9.6. She denies any  unusual bleeding.  Recent Labs: 05/20/2013: ALT 14  06/12/2013: Creatinine 1.4*; Hemoglobin 11.4*; Potassium 5.3*   Wt Readings from Last 3 Encounters:  06/12/13 223 lb (101.152 kg)  05/23/13 228 lb 13.4 oz (103.8 kg)  05/23/13 228 lb 13.4 oz (103.8 kg)     Past Medical History  Diagnosis Date  . Lumbar spondylosis   . Hyperlipidemia     takes Crestor daily as well as Lovaza  . Acid reflux   . Restless legs syndrome (RLS)   . Vitamin D deficiency     takes Vit D 50000 units weekly  . History of rectal bleeding   . Hepatomegaly     With splenomegaly  . Unspecified urinary incontinence   . Degeneration of lumbar or lumbosacral intervertebral disc   . Fatty pancreas   . Internal hemorrhoids   . LVH (left ventricular hypertrophy)   . Generalized anxiety disorder   . Anemia   . Hyponatremia   . Disorders of magnesium metabolism   . Alpha-1-antitrypsin deficiency     pt is not aware of this diagnosis  . Hemangioma of spleen   . Chronic combined systolic and diastolic CHF (congestive heart failure)     a. 04/2013 Echo: EF 25-30%.  . Non-ischemic cardiomyopathy     a. 04/2013 Echo: EF 25-30%, glob HK, basal and mid antsept/infersept/ant AK, Grade 4 DD, Triv AI/MR, mod dil LA.  . Mitral regurgitation     a. Prev noted to be mod-sev by TEE 02/2013;  b. 04/2013 Echo: EF 25-30%, triv MR.  Marland Kitchen  Diabetic neuropathy   . Chronic pain   . Tobacco abuse   . Severe obesity (BMI 35.0-39.9) with comorbidity   . Arthritis     Lower back and both hips with severe chronic pain and limited mobility  . Osteoporosis   . COPD (chronic obstructive pulmonary disease)     uses Albuterol daily as needed  . Panic attacks     takes Klonopin nightly  . Anxiety   . Type II diabetes mellitus     Insulin-dependent since 2008 - takes Novolog and Lantus daily as well as Metformin  . Hypertension     takes Diovan daily  . Childhood asthma   . Bell's palsy   . Headache(784.0)     d/t Bells palsy and last  headache 22months ago  . Peripheral neuropathy     takes Lyrica daily  . Fibromyalgia   . Joint Swelling and Pain     knees,shoulders,elbows,and hands  . Back pain     pt states over 100 bones spurs on her left hip;scoliosis  . History of gastric ulcer   . Multiple allergies     uses Flonase daily as needed  . History of colon polyps   . History of MRSA infection 2005    Reported in 2005. Also had I&D of groin abscess 05/2013 that grew MRSA.  Marland Kitchen History of shingles 2005  . Obstructive sleep apnea     a. mild OSA by 08/16/11 sleep study [facility]);  b. does not tolerate CPAP.    Current Outpatient Prescriptions  Medication Sig Dispense Refill  . albuterol (PROVENTIL HFA;VENTOLIN HFA) 108 (90 BASE) MCG/ACT inhaler Inhale 2 puffs into the lungs every 6 (six) hours as needed for wheezing or shortness of breath.      Marland Kitchen aspirin EC 81 MG tablet Take 81 mg by mouth daily.      . carvedilol (COREG) 25 MG tablet Take 25 mg by mouth 2 (two) times daily with a meal.      . chlorhexidine (PERIDEX) 0.12 % solution Rinse with 15 mls twice daily for 30 seconds. Use after breakfast and at bedtime. Spit out excess. Do not swallow.  960 mL  6  . clonazePAM (KLONOPIN) 1 MG tablet Take 1 mg by mouth at bedtime as needed for anxiety.      . cyclobenzaprine (FLEXERIL) 10 MG tablet Take 10 mg by mouth 3 (three) times daily as needed for muscle spasms.      . fluticasone (FLONASE) 50 MCG/ACT nasal spray Place 2 sprays into both nostrils at bedtime as needed for allergies.       Marland Kitchen HYDROcodone-acetaminophen (NORCO) 10-325 MG per tablet Take 1 tablet by mouth every 6 (six) hours as needed for moderate pain.       Marland Kitchen insulin aspart (NOVOLOG FLEXPEN) 100 UNIT/ML FlexPen Inject 6-36 Units into the skin 3 (three) times daily with meals. Sliding scale      . insulin glargine (LANTUS) 100 UNIT/ML injection Inject 80 Units into the skin 2 (two) times daily.       . Magnesium Oxide 250 MG TABS Take 1 tablet by  mouth daily.      . metFORMIN (GLUCOPHAGE) 1000 MG tablet Take 1,000 mg by mouth 2 (two) times daily with a meal.      . nitroGLYCERIN (NITROSTAT) 0.4 MG SL tablet Place 0.4 mg under the tongue every 5 (five) minutes as needed for chest pain.      Marland Kitchen omega-3 acid ethyl  esters (LOVAZA) 1 G capsule Take 4 g by mouth daily.       Marland Kitchen omeprazole (PRILOSEC) 40 MG capsule Take 40 mg by mouth daily.      . pregabalin (LYRICA) 150 MG capsule Take 150 mg by mouth every evening.       . rosuvastatin (CRESTOR) 40 MG tablet Take 40 mg by mouth daily.      . valsartan (DIOVAN) 160 MG tablet Take 160 mg by mouth daily.      . Vitamin D, Ergocalciferol, (DRISDOL) 50000 UNITS CAPS capsule Take 50,000 Units by mouth every 7 (seven) days. Tuesdays       No current facility-administered medications for this visit.    Allergies:   Sulfa antibiotics and Adhesive   Social History:  The patient  reports that she has been smoking Cigarettes.  She started smoking about 39 years ago. She has a 9.5 pack-year smoking history. She has never used smokeless tobacco. She reports that she does not drink alcohol or use illicit drugs.   Family History:  The patient's family history includes Diabetes in her father; Heart disease in her father and mother; Hyperlipidemia in her father and mother; Hypertension in her father and mother.   ROS:  Please see the history of present illness.   All other systems reviewed and negative.   PHYSICAL EXAM:  VS:  BP 130/70  Pulse 97  Ht 5\' 3"  (1.6 m)  Wt 223 lb (101.152 kg)  BMI 39.51 kg/m2  SpO2 98% Well nourished, well developed overweight WF in no acute distress HEENT: normal Neck: no JVD Cardiac:  normal S1, S2; RRR, extremely faint apical murmur Lungs:  clear to auscultation bilaterally, no wheezing, rhonchi or rales Abd: soft, nontender, no hepatomegaly Ext: no edema Skin: warm and dry Neuro:  moves all extremities spontaneously, no focal abnormalities noted  ASSESSMENT AND  PLAN: Pt was seen and examined with Dr. Rayann Heman.  1. Mitral regurgitation - The patient originally had planned to f/u with Dr. Enrique Sack then be released to see Dr. Roxy Manns. We will touch base with Dr. Guy Sandifer office to confer as to whether or not he still would like to see her if MR by most recent echo is reported as trivial, contrary to what original TEE last fall showed.   2. Nonischemic cardiomyopathy -   timing is not ideal to place ICD due to recent MRSA infection. She will follow up in 3 months to reassess and may be a candidate for SQ- ICD. She was instructed on importance of taking her Coreg (will be picking up RX today after appointment) - this should help with HR. She will let us know if she develops any interim infections as well. Currently euvolemic. 3. L groin abscess s/p I&D - has f/u with ID and has completed course of doxycycline. 4. Acute renal insufficiency - Cr went up to 2 in the hospital. No recent recheck. Will recheck today. 5. Anemia - suspect related to dental surgery but will recheck CBC to ensure stability (13->9.6).  Dispo: f/u with Dr. Enrique Sack as planned, f/u with Dr. Claudie Leach in 2-3 weeks, f/u with Dr. Rayann Heman in 3 months to reassess readiness for ICD. Will await to hear from Dr. Roxy Manns if he would still like to see her (have spoken with Cecilio Asper and will send Epic message along with this office note).  Signed, Melina Copa, PA-C  06/15/2013 10:34 PM    I have seen, examined the patient, and reviewed the above assessment  and plan.  Changes to above are made where necessary.    Co Sign: Thompson Grayer, MD 06/15/2013 10:36 PM

## 2013-06-13 ENCOUNTER — Telehealth: Payer: Self-pay | Admitting: *Deleted

## 2013-06-13 NOTE — Telephone Encounter (Signed)
S/w pt to let  Pt know Dr. Roxy Manns did not need to see pt and a messge was routed to Dr. Enrique Sack and Dr. Rayann Heman pt verbally understood

## 2013-06-13 NOTE — Telephone Encounter (Addendum)
See above. Discussed pt with Dr. Roxy Manns through [facility] - he did not feel that he needed to see her back unless Dr. Rayann Heman or the patient wanted to see him. Dr. Rayann Heman did not feel that she needs eval for surgical intervention at this time given trivial MR on echo. Danielle notified the patient as previously noted. Dayna Dunn PA-C

## 2013-06-17 ENCOUNTER — Ambulatory Visit (INDEPENDENT_AMBULATORY_CARE_PROVIDER_SITE_OTHER): Payer: Medicaid Other | Admitting: General Surgery

## 2013-06-17 ENCOUNTER — Encounter (INDEPENDENT_AMBULATORY_CARE_PROVIDER_SITE_OTHER): Payer: Self-pay

## 2013-06-17 ENCOUNTER — Ambulatory Visit (HOSPITAL_COMMUNITY): Payer: Medicaid - Dental | Admitting: Dentistry

## 2013-06-17 ENCOUNTER — Encounter (HOSPITAL_COMMUNITY): Payer: Self-pay | Admitting: Dentistry

## 2013-06-17 VITALS — BP 110/70 | HR 80 | Temp 97.4°F | Resp 16 | Ht 63.0 in | Wt 228.4 lb

## 2013-06-17 DIAGNOSIS — L02214 Cutaneous abscess of groin: Secondary | ICD-10-CM

## 2013-06-17 DIAGNOSIS — L03319 Cellulitis of trunk, unspecified: Secondary | ICD-10-CM

## 2013-06-17 DIAGNOSIS — L02219 Cutaneous abscess of trunk, unspecified: Secondary | ICD-10-CM

## 2013-06-17 MED ORDER — SODIUM FLUORIDE 1.1 % DT CREA
TOPICAL_CREAM | DENTAL | Status: DC
Start: 2013-06-17 — End: 2013-09-10

## 2013-06-17 NOTE — Patient Instructions (Addendum)
Continue dry dressings to protect area until scab forms or fully healed.  Remember prevention for small pimples in the groin - heat, antibacterial soap, antibiotic ointment, dressings to prevent friction etc.

## 2013-06-17 NOTE — Progress Notes (Signed)
Limited oral examination:  06/17/2013 Ivi Stanbro [id number]  VITALS: BP 122/74  Pulse 80  Temp(Src) 98.2 F (36.8 C) (Oral)   LABS:  Lab Results  Component Value Date   WBC 8.9 06/12/2013   HGB 11.4* 06/12/2013   HCT 34.1* 06/12/2013   MCV 91.9 06/12/2013   PLT 230.0 06/12/2013   BMET    Component Value Date/Time   NA 138 06/12/2013 1502   K 5.3* 06/12/2013 1502   CL 105 06/12/2013 1502   CO2 28 06/12/2013 1502   GLUCOSE 167* 06/12/2013 1502   BUN 35* 06/12/2013 1502   CREATININE 1.4* 06/12/2013 1502   CALCIUM 9.3 06/12/2013 1502   GFRNONAA 28* 05/23/2013 1230   GFRAA 32* 05/23/2013 1230    No results found for this basename: INR,  PROTIME   No results found for this basename: PTT     Khadajah Eachus is status post multiple extractions with alveoloplasty and gross debridement of remaining dentition on 05/21/2013 in the operating room. Patient was kept overnight for observation for H/O sleep apnea when elevated white count was noted. Patient subsequently found to have a groin abscess. Patient then had an incision drainage procedure performed by surgery team. Patient was then discharged on antibiotic therapy and pain medication. The patient was seen for postop evaluation and suture removal on 06/02/2013. Delayed healing was noted at that time.  The patient now presents for limited oral examination and reevaluation of healing after dental extractions.  SUBJECTIVE: Patient currently is denying any significant oral discomfort. Patient has been using salt water rinses as needed. Patient has been using chlorhexidine rinses as prescribed.  Patient again indicates that she feels much better with her teeth out. Patient indicates that she is no longer planned to have heart valve surgery. Patient scheduled to see Dr. Rayann Heman in late July 2015 for evaluation for placement of a defibrillator.  EXAM: There is no sign of infection, heme, or ooze. Extraction sites are healing in by secondary  intention. There is soft tissue now covering all extraction sites.  The patient has severe xerostomia. Plaque accumulations are noted. Oral hygiene improvement was suggested.  ASSESSMENT: Post operative course is consistent with dental procedures performed in the operating room on 05/21/2013. Delayed healing is noted from the extraction sites in area #12, 19, and 30 now with soft tissue covering all extraction sites.  PLAN: 1. Patient is to continue chlorhexidine rinses. Patient is to rinse with 15 ML twice daily after breakfast and dinner.   2. Continue salt water rinses in between chlorhexidine rinses as needed to aid healing. 3. Brush teeth after meals and at bedtime. Brush tongue daily. 4. Brush teeth with Previdient 5000+ sodium fluoride toothpaste as prescribed. Rx to [facility] today. 5. Return to clinic in early July of 2015 for re-evaluation of healing.  6. Patient is currently cleared for defibrillator placement with Dr. Rayann Heman of needed.  7. Follow up with a primary dentist of her choice for  continued periodontal care and fabrication of partial dentures as indicated after adequate healing.  [name], DDS

## 2013-06-17 NOTE — Progress Notes (Signed)
Subjective: Audrey Olson is a 53 y.o. female who presents today for follow up from a bedside incision and drainage procedure for a left groin abscess.  She was discharged from the hospital on 05/23/13.  The patient's dressing changes are going well and [place] care has been coming out to check the progress of her wound care.  She says she has no pain and the wound is very tiny (the size of a pea).  No drainage or redness noted.  She is no longer needing to pack it.  She's just applying a dry dressing.   Objective: Vital signs in last 24 hours: Reviewed   PE: General:  Alert, NAD, pleasant Left groin:  41mm round erythematous ulcerative wound which appears well healed compared to last physical exam.  No signs of infection.  No drainage or erythema.   Assessment/Plan  1.  MRSA infection S/P bedside I&D left groin abscess: doing well, dressing changes with dry dressing to protect wound until completely healed.  Won't need any additional HH, she can manage on her own.  Discussed prevention with antibacterial soap, heat therapy, antibiotic ointment, prevent friction, etc.    Coralie Keens, PA-C 06/17/2013

## 2013-06-17 NOTE — Patient Instructions (Signed)
PLAN: 1. Patient is to continue chlorhexidine rinses. Patient is to rinse with 15 ML twice daily after breakfast and dinner.   2. Continue salt water rinses in between chlorhexidine rinses as needed to aid healing. 3. Brush teeth after meals and at bedtime. Brush tongue daily. 4. Brush teeth with Previdient 5000+ sodium fluoride toothpaste as prescribed. Rx to [employer] [facility] today. 5. Return to clinic in early July of 2015 for re-evaluation of healing.  6. Patient is currently cleared for defibrillator placement with Dr. Rayann Heman of needed.  7. Follow up with a primary dentist of her choice for  continued periodontal care and fabrication of partial dentures as indicated after adequate healing.  [name], DDS

## 2013-07-01 ENCOUNTER — Inpatient Hospital Stay: Payer: Self-pay | Admitting: Infectious Disease

## 2013-08-25 ENCOUNTER — Encounter (HOSPITAL_COMMUNITY): Payer: Self-pay | Admitting: Dentistry

## 2013-08-25 ENCOUNTER — Ambulatory Visit (HOSPITAL_COMMUNITY): Payer: Medicaid - Dental | Admitting: Dentistry

## 2013-08-25 VITALS — BP 142/81 | HR 82 | Temp 98.2°F

## 2013-08-25 DIAGNOSIS — K036 Deposits [accretions] on teeth: Secondary | ICD-10-CM

## 2013-08-25 DIAGNOSIS — R682 Dry mouth, unspecified: Secondary | ICD-10-CM

## 2013-08-25 DIAGNOSIS — Z0189 Encounter for other specified special examinations: Secondary | ICD-10-CM

## 2013-08-25 DIAGNOSIS — I059 Rheumatic mitral valve disease, unspecified: Secondary | ICD-10-CM

## 2013-08-25 DIAGNOSIS — K08409 Partial loss of teeth, unspecified cause, unspecified class: Secondary | ICD-10-CM

## 2013-08-25 DIAGNOSIS — IMO0002 Reserved for concepts with insufficient information to code with codable children: Secondary | ICD-10-CM

## 2013-08-25 DIAGNOSIS — K117 Disturbances of salivary secretion: Secondary | ICD-10-CM

## 2013-08-25 DIAGNOSIS — K08109 Complete loss of teeth, unspecified cause, unspecified class: Secondary | ICD-10-CM

## 2013-08-25 DIAGNOSIS — K0530 Chronic periodontitis, unspecified: Secondary | ICD-10-CM

## 2013-08-25 NOTE — Progress Notes (Signed)
Limited oral examination:  08/25/2013 Audrey Olson [id number]  VITALS: BP 142/81  Pulse 82  Temp(Src) 98.2 F (36.8 C) (Oral)   LABS:  Lab Results  Component Value Date   WBC 8.9 06/12/2013   HGB 11.4* 06/12/2013   HCT 34.1* 06/12/2013   MCV 91.9 06/12/2013   PLT 230.0 06/12/2013   BMET    Component Value Date/Time   NA 138 06/12/2013 1502   K 5.3* 06/12/2013 1502   CL 105 06/12/2013 1502   CO2 28 06/12/2013 1502   GLUCOSE 167* 06/12/2013 1502   BUN 35* 06/12/2013 1502   CREATININE 1.4* 06/12/2013 1502   CALCIUM 9.3 06/12/2013 1502   GFRNONAA 28* 05/23/2013 1230   GFRAA 32* 05/23/2013 1230    No results found for this basename: INR,  PROTIME   No results found for this basename: PTT     Audrey Olson is status post multiple extractions with alveoloplasty and gross debridement of remaining dentition on 05/21/2013 in the operating room. Patient was kept overnight for observation for H/O sleep apnea when elevated white count was noted. Patient subsequently found to have a groin abscess. Patient then had an incision drainage procedure performed by surgery team. Patient was then discharged on antibiotic therapy and pain medication. The patient was seen for postop evaluation and suture removal on 06/02/2013. Delayed healing was noted at that time.  Patient was seen for reevaluation of healing on 06/17/2013. Delayed healing was progressing well but felt that one more reevaluation of healing was required in July. The patient now presents for limited oral examination and reevaluation of healing after dental extractions.  SUBJECTIVE: Patient currently is denying any significant oral discomfort. Patient has been using salt water rinses as needed. Patient has been using chlorhexidine rinses as prescribed.  Patient again indicates that she feels much better with her teeth out. Patient scheduled to see Dr. Rayann Heman in late July 2015 for evaluation for placement of a  defibrillator.  EXAM: There is no sign of infection, heme, or ooze. Extraction sites have complete healing.   The patient has severe xerostomia. Plaque accumulations are noted. Oral hygiene improvement was suggested.  ASSESSMENT: Post operative course is consistent with dental procedures performed in the operating room on 05/21/2013. Delayed healing is noted from the extraction sites in area #12, 19, and 30 now with complete healing. Xerostomia Missing teeth Chronic periodontitis Accretions   PLAN: 1. Patient is to continue chlorhexidine rinses. Patient is to rinse with 15 ML twice daily after breakfast and dinner.   2. Continue salt water rinses in between chlorhexidine rinses as needed to aid healing. 3. Brush teeth after meals and at bedtime. Brush tongue daily. 4. Brush teeth with Prevident 5000+ sodium fluoride toothpaste as prescribed.  5. Patient is currently cleared for defibrillator placement with Dr. Rayann Heman as needed.  6. Follow up with a primary dentist of her choice for  continued periodontal care and fabrication of partial dentures as indicated after adequate healing.  Audrey Olson, DDS

## 2013-08-25 NOTE — Patient Instructions (Addendum)
PLAN: 1. Patient is to continue chlorhexidine rinses. Patient is to rinse with 15 ML twice daily after breakfast and dinner.   2. Continue salt water rinses in between chlorhexidine rinses as needed to aid healing. 3. Brush teeth after meals and at bedtime. Brush tongue daily. 4. Brush teeth with Previdient 5000+ sodium fluoride toothpaste as prescribed.  5. Patient is currently cleared for defibrillator placement with Dr. Rayann Heman of needed.  6. Follow up with a primary dentist of her choice for  continued periodontal care and fabrication of partial dentures as indicated after adequate healing.  [name], DDS

## 2013-09-08 ENCOUNTER — Other Ambulatory Visit: Payer: Self-pay

## 2013-09-10 ENCOUNTER — Ambulatory Visit (INDEPENDENT_AMBULATORY_CARE_PROVIDER_SITE_OTHER): Payer: Medicaid Other | Admitting: Internal Medicine

## 2013-09-10 ENCOUNTER — Encounter: Payer: Self-pay | Admitting: Internal Medicine

## 2013-09-10 VITALS — BP 147/84 | HR 83 | Ht 63.0 in | Wt 221.0 lb

## 2013-09-10 DIAGNOSIS — I10 Essential (primary) hypertension: Secondary | ICD-10-CM

## 2013-09-10 DIAGNOSIS — I428 Other cardiomyopathies: Secondary | ICD-10-CM

## 2013-09-10 DIAGNOSIS — Z720 Tobacco use: Secondary | ICD-10-CM

## 2013-09-10 DIAGNOSIS — G4733 Obstructive sleep apnea (adult) (pediatric): Secondary | ICD-10-CM

## 2013-09-10 DIAGNOSIS — F17200 Nicotine dependence, unspecified, uncomplicated: Secondary | ICD-10-CM

## 2013-09-10 NOTE — Patient Instructions (Signed)
Your physician recommends that you schedule a follow-up appointment as needed  If you decide to proceed will need to be seen by Dr Caryl Comes for Sub-Q ICD

## 2013-09-10 NOTE — Progress Notes (Signed)
Primary Care Physician: Glendon Axe, MD Referring Physician:  Dr Lawson Radar  Audrey Olson is a 53 y.o. female with a h/o a nonischemic cardiomyopathy, NYHA Class III CHF, and mitral regurgitation who presents today for EP follow-up.  Since I saw her, she has had her teeth extracted.  She also developed a groin abcess which has been treated and healed.  She continues to smoke.  She has made no progress with lifestyle changes. Today, she denies symptoms of palpitations, chest pain, lower extremity edema, dizziness, presyncope, syncope, or neurologic sequela. The patient is tolerating medications without difficulties and is otherwise without complaint today.   Past Medical History  Diagnosis Date  . Lumbar spondylosis   . Hyperlipidemia     takes Crestor daily as well as Lovaza  . Acid reflux   . Restless legs syndrome (RLS)   . Vitamin D deficiency     takes Vit D 50000 units weekly  . History of rectal bleeding   . Hepatomegaly     With splenomegaly  . Unspecified urinary incontinence   . Degeneration of lumbar or lumbosacral intervertebral disc   . Fatty pancreas   . Internal hemorrhoids   . LVH (left ventricular hypertrophy)   . Generalized anxiety disorder   . Anemia   . Hyponatremia   . Disorders of magnesium metabolism   . Alpha-1-antitrypsin deficiency     pt is not aware of this diagnosis  . Hemangioma of spleen   . Chronic combined systolic and diastolic CHF (congestive heart failure)     a. 04/2013 Echo: EF 25-30%.  . Non-ischemic cardiomyopathy     a. 04/2013 Echo: EF 25-30%, glob HK, basal and mid antsept/infersept/ant AK, Grade 4 DD, Triv AI/MR, mod dil LA.  . Mitral regurgitation     a. Prev noted to be mod-sev by TEE 02/2013;  b. 04/2013 Echo: EF 25-30%, triv MR.  . Diabetic neuropathy   . Chronic pain   . Tobacco abuse   . Severe obesity (BMI 35.0-39.9) with comorbidity   . Arthritis     Lower back and both hips with severe chronic pain and limited mobility   . Osteoporosis   . COPD (chronic obstructive pulmonary disease)     uses Albuterol daily as needed  . Panic attacks     takes Klonopin nightly  . Anxiety   . Type II diabetes mellitus     Insulin-dependent since 2008 - takes Novolog and Lantus daily as well as Metformin  . Hypertension     takes Diovan daily  . Childhood asthma   . Bell's palsy   . Headache(784.0)     d/t Bells palsy and last headache 65months ago  . Peripheral neuropathy     takes Lyrica daily  . Fibromyalgia   . Joint Swelling and Pain     knees,shoulders,elbows,and hands  . Back pain     pt states over 100 bones spurs on her left hip;scoliosis  . History of gastric ulcer   . Multiple allergies     uses Flonase daily as needed  . History of colon polyps   . History of MRSA infection 2005    Reported in 2005. Also had I&D of groin abscess 05/2013 that grew MRSA.  Marland Kitchen History of shingles 2005  . Obstructive sleep apnea     a. mild OSA by 08/16/11 sleep study [facility]);  b. does not tolerate CPAP.   Past Surgical History  Procedure Laterality Date  . Nasal  sinus surgery    . Appendectomy    . Cholecystectomy    . Incisional hernia repair    . Breast biopsy Left   . Cardiac catheterization  around 2007/2014    x 2  . Ganglion cyst removed Left   . Colonoscopy    . Esophagogastroduodenoscopy    . Multiple tooth extractions  05/21/2013    TOOTH #12  #19  WITH ALVEOLOPLASTY    . Multiple extractions with alveoloplasty N/A 05/21/2013    Procedure: Extraction of tooth #'s 12,19, and 30 with alveoloplasty and gross debridement of remaining teeth;  Surgeon: [name], DDS;  Location: [facility];  Service: Oral Surgery;  Laterality: N/A;    Current Outpatient Prescriptions  Medication Sig Dispense Refill  . albuterol (PROVENTIL HFA;VENTOLIN HFA) 108 (90 BASE) MCG/ACT inhaler Inhale 2 puffs into the lungs every 6 (six) hours as needed for wheezing or shortness of breath.      Marland Kitchen aspirin EC 81 MG  tablet Take 81 mg by mouth daily.      . carvedilol (COREG) 25 MG tablet Take 25 mg by mouth 2 (two) times daily with a meal.      . clonazePAM (KLONOPIN) 1 MG tablet Take 1 mg by mouth at bedtime as needed for anxiety.      . cyclobenzaprine (FLEXERIL) 10 MG tablet Take 10 mg by mouth 3 (three) times daily as needed for muscle spasms.      . fluticasone (FLONASE) 50 MCG/ACT nasal spray Place 2 sprays into both nostrils at bedtime as needed for allergies.       Marland Kitchen HYDROcodone-acetaminophen (NORCO) 10-325 MG per tablet Take 1 tablet by mouth every 6 (six) hours as needed for moderate pain.       Marland Kitchen insulin aspart (NOVOLOG FLEXPEN) 100 UNIT/ML FlexPen Inject 6-36 Units into the skin 3 (three) times daily with meals. Sliding scale      . insulin glargine (LANTUS) 100 UNIT/ML injection Inject 80 Units into the skin 2 (two) times daily.       . Magnesium Oxide 250 MG TABS Take 1 tablet by mouth daily.      . metFORMIN (GLUCOPHAGE) 1000 MG tablet Take 1,000 mg by mouth 2 (two) times daily with a meal.      . nitroGLYCERIN (NITROSTAT) 0.4 MG SL tablet Place 0.4 mg under the tongue every 5 (five) minutes as needed for chest pain.      Marland Kitchen omega-3 acid ethyl esters (LOVAZA) 1 G capsule Take 4 g by mouth daily.       Marland Kitchen omeprazole (PRILOSEC) 40 MG capsule Take 40 mg by mouth daily.      . pregabalin (LYRICA) 150 MG capsule Take 150 mg by mouth every evening.       . rosuvastatin (CRESTOR) 40 MG tablet Take 40 mg by mouth daily.      . valsartan (DIOVAN) 160 MG tablet Take 160 mg by mouth daily.      . Vitamin D, Ergocalciferol, (DRISDOL) 50000 UNITS CAPS capsule Take 50,000 Units by mouth every 7 (seven) days. Tuesdays       No current facility-administered medications for this visit.    Allergies  Allergen Reactions  . Sulfa Antibiotics Anaphylaxis    Anaphylaxis with Sulfa only when combined with 800 mg Ibuprofen  . Adhesive [Tape]     Breaks out    History   Social History  . Marital Status:  Divorced    Spouse Name: N/A  Number of Children: 2  . Years of Education: N/A   Occupational History  . Not on file.   Social History Main Topics  . Smoking status: Current Every Day Smoker -- 0.25 packs/day for 38 years    Types: Cigarettes    Start date: 02/13/1974  . Smokeless tobacco: Never Used     Comment: 05/2013: currently smoking 1/3 ppd.  . Alcohol Use: No     Comment: rare  . Drug Use: No  . Sexual Activity: Not Currently    Birth Control/ Protection: Post-menopausal   Other Topics Concern  . Not on file   Social History Narrative   Lives in [place] with sister.  Unemployed    Family History  Problem Relation Age of Onset  . Hyperlipidemia Mother   . Hypertension Mother   . Heart disease Mother   . Hyperlipidemia Father   . Diabetes Father     DM 2   . Hypertension Father   . Heart disease Father     ROS- All systems are reviewed and negative except as per the HPI above  Physical Exam: Filed Vitals:   09/10/13 1026  BP: 147/84  Pulse: 83  Height: 5\' 3"  (1.6 m)  Weight: 221 lb (100.245 kg)    GEN- The patient is overweight appearing, alert and oriented x 3 today.   Head- normocephalic, atraumatic Eyes-  Sclera clear, conjunctiva pink Ears- hearing intact Oropharynx- clear Neck- supple, no JVP Lymph- no cervical lymphadenopathy Lungs- Clear to ausculation bilaterally, normal work of breathing Heart- Regular rate and rhythm  GI- soft, NT, ND, + BS Extremities- no clubbing, cyanosis, or edema Neuro- strength and sensation are intact  Echo 3/15 reviewed (as above)  ekg today reveals sinus rhythm septal infarct  Assessment and Plan:  1.  Nonischemic CM (EF 30%), NYHA Class III CHF  We had a long discussion about pros and cons of an ICD today.  I worry about her risks of infection given recent skin abscess.  I stressed the importance of lifestyle changes of smoking cessation and weight reduction as being first and foremost in her  efforts to life long.  Risks, benefits, alternatives to ICD implantation were discussed in detail with the patient today.  She would like to further contemplate this option.  If she decides to proceed, I would recommend a Sub Q over transvenous ICD given long term concerns of infection.  She would require evaluation by Dr Caryl Comes for S-ICD.  For now, she will continue to follow closely with Dr Claudie Leach for medical therapy.  She will contact my office if she decides to have an ICD implanted in the future.  2. HTN Stable No change required today  3. OSA Compliance with weight loss is encouraged  4. Tobacco cessation is advised

## 2014-12-15 ENCOUNTER — Emergency Department (HOSPITAL_COMMUNITY): Payer: Medicaid Other

## 2014-12-15 ENCOUNTER — Encounter (HOSPITAL_BASED_OUTPATIENT_CLINIC_OR_DEPARTMENT_OTHER): Payer: Self-pay | Admitting: *Deleted

## 2014-12-15 ENCOUNTER — Emergency Department (HOSPITAL_BASED_OUTPATIENT_CLINIC_OR_DEPARTMENT_OTHER): Payer: Medicaid Other

## 2014-12-15 ENCOUNTER — Emergency Department (HOSPITAL_BASED_OUTPATIENT_CLINIC_OR_DEPARTMENT_OTHER)
Admission: EM | Admit: 2014-12-15 | Discharge: 2014-12-15 | Disposition: A | Discharge status: Home / Self Care | Payer: Medicaid Other | Attending: Emergency Medicine | Admitting: Emergency Medicine

## 2014-12-15 DIAGNOSIS — S0011XA Contusion of right eyelid and periocular area, initial encounter: Secondary | ICD-10-CM | POA: Insufficient documentation

## 2014-12-15 DIAGNOSIS — S0990XA Unspecified injury of head, initial encounter: Secondary | ICD-10-CM | POA: Diagnosis present

## 2014-12-15 DIAGNOSIS — E785 Hyperlipidemia, unspecified: Secondary | ICD-10-CM | POA: Insufficient documentation

## 2014-12-15 DIAGNOSIS — Y998 Other external cause status: Secondary | ICD-10-CM | POA: Diagnosis not present

## 2014-12-15 DIAGNOSIS — J449 Chronic obstructive pulmonary disease, unspecified: Secondary | ICD-10-CM | POA: Insufficient documentation

## 2014-12-15 DIAGNOSIS — Y92009 Unspecified place in unspecified non-institutional (private) residence as the place of occurrence of the external cause: Secondary | ICD-10-CM | POA: Insufficient documentation

## 2014-12-15 DIAGNOSIS — R9402 Abnormal brain scan: Secondary | ICD-10-CM | POA: Insufficient documentation

## 2014-12-15 DIAGNOSIS — E1140 Type 2 diabetes mellitus with diabetic neuropathy, unspecified: Secondary | ICD-10-CM | POA: Diagnosis not present

## 2014-12-15 DIAGNOSIS — IMO0001 Reserved for inherently not codable concepts without codable children: Secondary | ICD-10-CM

## 2014-12-15 DIAGNOSIS — S06350A Traumatic hemorrhage of left cerebrum without loss of consciousness, initial encounter: Secondary | ICD-10-CM

## 2014-12-15 DIAGNOSIS — Z9981 Dependence on supplemental oxygen: Secondary | ICD-10-CM | POA: Insufficient documentation

## 2014-12-15 DIAGNOSIS — H05231 Hemorrhage of right orbit: Secondary | ICD-10-CM

## 2014-12-15 DIAGNOSIS — R9089 Other abnormal findings on diagnostic imaging of central nervous system: Secondary | ICD-10-CM

## 2014-12-15 DIAGNOSIS — Y939 Activity, unspecified: Secondary | ICD-10-CM | POA: Insufficient documentation

## 2014-12-15 DIAGNOSIS — W01198A Fall on same level from slipping, tripping and stumbling with subsequent striking against other object, initial encounter: Secondary | ICD-10-CM | POA: Insufficient documentation

## 2014-12-15 MED ORDER — ACETAMINOPHEN 500 MG PO TABS
1000.0000 mg | ORAL_TABLET | Freq: Once | ORAL | Status: AC
  Administered 2014-12-15: 1000 mg via ORAL
  Filled 2014-12-15: qty 2

## 2014-12-15 NOTE — ED Notes (Signed)
Son remains at bedside. Pt in MRI.

## 2014-12-15 NOTE — ED Notes (Signed)
Tasha, MRI - advised has order for MRI for pt.

## 2014-12-15 NOTE — ED Notes (Signed)
Report given to [facility] with [facility].

## 2014-12-15 NOTE — ED Notes (Signed)
[facility] arrived to transfer pt to [facility]. Care turned over To [facility] staff.

## 2014-12-15 NOTE — ED Notes (Signed)
Dr Oleta Mouse in w/pt.

## 2014-12-15 NOTE — ED Notes (Signed)
Pt from [facility] - pt noted w/swelling and discoloration to right face including right eye. States had fallen - d/t tripped over her oxygen tubing. Denies LOC.

## 2014-12-15 NOTE — ED Notes (Signed)
Pt sister Regino Schultze called to check on pt. Pt states okay to talk with sister about her care. Pt sister told pt is stable and will be transferred by ambulance to [facility].

## 2014-12-15 NOTE — ED Notes (Signed)
State she is on O2 at home and tripped over tubing and hit head on another O2 tank. Bruising to right eye and forehead.Laceration to left forehead. No LOC. No dizziness. Nausea but has had GI upset over past few days. No other injury.

## 2014-12-15 NOTE — ED Notes (Signed)
EMS started IV SL to left f/a and gave zofran 4 mg for nausea. Accu check by EMS 113.

## 2014-12-15 NOTE — ED Notes (Signed)
Report given to [place] Charge RN at [facility] ED.

## 2014-12-15 NOTE — ED Provider Notes (Signed)
Please see previous physicians note regarding patient's presenting history and physical, initial ED course, and associated MDM. In short this a 54 year old female who takes aspirin and had a mechanical fall today onto her face. Was transferred to [facility] emergency department from [facility] [place] for MRI as her CT head showed subacute abnormalities. MRI was performed, visualized, showing evidence of a left occipital hematoma that appears subacute in nature. On my evaluation she complains of minimal 2 out of 10 in severity headache and is neurologically intact. I discussed these findings with Dr. Kathyrn Sheriff from neurosurgery, who did not recommend admission or observation for this. He feels that this is not consistent with an acute traumatic bleed, and recommended follow-up follow-up with him in clinic in 4 weeks with repeat imaging then. I've given her strict return instructions. She expressed understanding of all discharge instructions for comfortable with the plan of care.  Forde Dandy, MD 12/15/14 [phone]

## 2014-12-15 NOTE — ED Notes (Signed)
Pharmacy Techs remain w/pt.

## 2014-12-15 NOTE — ED Notes (Signed)
Dr Sabra Heck aware pt has arrived to [facility].

## 2014-12-15 NOTE — ED Notes (Signed)
Ice applied to pt's head d/t c/o headache.

## 2014-12-15 NOTE — Discharge Instructions (Signed)
Please call the neurosurgeon to set up a follow-up appointment in 4 weeks, with plans to reimage your head then. Return without fail for worsening symptoms, including worsening headache, vomiting unable to keep down food or fluids, new numbness or weakness, new vision or speech changes, confusion, or any other symptoms concerning to you.  Head Injury, Adult You have a head injury. Headaches and throwing up (vomiting) are common after a head injury. It should be easy to wake up from sleeping. Sometimes you must stay in the hospital. Most problems happen within the first 24 hours. Side effects may occur up to 7-10 days after the injury.  WHAT ARE THE TYPES OF HEAD INJURIES? Head injuries can be as minor as a bump. Some head injuries can be more severe. More severe head injuries include:  A jarring injury to the brain (concussion).  A bruise of the brain (contusion). This mean there is bleeding in the brain that can cause swelling.  A cracked skull (skull fracture).  Bleeding in the brain that collects, clots, and forms a bump (hematoma). WHEN SHOULD I GET HELP RIGHT AWAY?   You are confused or sleepy.  You cannot be woken up.  You feel sick to your stomach (nauseous) or keep throwing up (vomiting).  Your dizziness or unsteadiness is getting worse.  You have very bad, lasting headaches that are not helped by medicine. Take medicines only as told by your doctor.  You cannot use your arms or legs like normal.  You cannot walk.  You notice changes in the black spots in the center of the colored part of your eye (pupil).  You have clear or bloody fluid coming from your nose or ears.  You have trouble seeing. During the next 24 hours after the injury, you must stay with someone who can watch you. This person should get help right away (call 911 in the [place] if you start to shake and are not able to control it (have seizures), you pass out, or you are unable to wake up. HOW CAN I PREVENT A  HEAD INJURY IN THE FUTURE?  Wear seat belts.  Wear a helmet while bike riding and playing sports like football.  Stay away from dangerous activities around the house. WHEN CAN I RETURN TO NORMAL ACTIVITIES AND ATHLETICS? See your doctor before doing these activities. You should not do normal activities or play contact sports until 1 week after the following symptoms have stopped:  Headache that does not go away.  Dizziness.  Poor attention.  Confusion.  Memory problems.  Sickness to your stomach or throwing up.  Tiredness.  Fussiness.  Bothered by bright lights or loud noises.  Anxiousness or depression.  Restless sleep. MAKE SURE YOU:   Understand these instructions.  Will watch your condition.  Will get help right away if you are not doing well or get worse.   This information is not intended to replace advice given to you by your health care provider. Make sure you discuss any questions you have with your health care provider.   Document Released: 01/13/2008 Document Revised: 02/20/2014 Document Reviewed: 10/07/2012 Elsevier Interactive Patient Education [employer].

## 2014-12-15 NOTE — ED Provider Notes (Signed)
CSN: [id number]     Arrival date & time 12/15/14  0809 History   First MD Initiated Contact with Patient 12/15/14 0810     No chief complaint on file.    (Consider location/radiation/quality/duration/timing/severity/associated sxs/prior Treatment) HPI Patient had recently been started on oxygen within about the past week. This is initiated after a hospitalization for pneumonia where upon she remained somewhat hypoxic. She reports that she is not used to that oxygen tubing and got up this morning to help her grandchild get ready for school. She tripped in the tubing and fell forward hitting her right forehead just above her eye on the concentrator machine. She states that she did not get knocked out. She reports she does have a fairly severe headache. No associated injury to the neck thorax abdomen or extremities. She reports she felt just a little dazed right after it happened but has not felt confusion or incoordination. She reports she takes a daily aspirin but no other blood thinners. Past Medical History  Diagnosis Date  . Lumbar spondylosis   . Hyperlipidemia     takes Crestor daily as well as Lovaza  . Acid reflux   . Restless legs syndrome (RLS)   . Vitamin D deficiency     takes Vit D 50000 units weekly  . History of rectal bleeding   . Hepatomegaly     With splenomegaly  . Unspecified urinary incontinence   . Degeneration of lumbar or lumbosacral intervertebral disc   . Fatty pancreas ([place])   . Internal hemorrhoids   . LVH (left ventricular hypertrophy)   . Generalized anxiety disorder   . Anemia   . Hyponatremia   . Disorders of magnesium metabolism   . Alpha-1-antitrypsin deficiency ([place])     pt is not aware of this diagnosis  . Hemangioma of spleen   . Chronic combined systolic and diastolic CHF (congestive heart failure) ([place])     a. 04/2013 Echo: EF 25-30%.  . Non-ischemic cardiomyopathy ([place])     a. 04/2013 Echo: EF 25-30%, glob HK, basal and mid  antsept/infersept/ant AK, Grade 4 DD, Triv AI/MR, mod dil LA.  . Mitral regurgitation     a. Prev noted to be mod-sev by TEE 02/2013;  b. 04/2013 Echo: EF 25-30%, triv MR.  . Diabetic neuropathy ([place])   . Chronic pain   . Tobacco abuse   . Severe obesity (BMI 35.0-39.9) with comorbidity ([place])   . Arthritis     Lower back and both hips with severe chronic pain and limited mobility  . Osteoporosis   . COPD (chronic obstructive pulmonary disease) ([facility])     uses Albuterol daily as needed  . Panic attacks     takes Klonopin nightly  . Anxiety   . Type II diabetes mellitus ([place])     Insulin-dependent since 2008 - takes Novolog and Lantus daily as well as Metformin  . Hypertension     takes Diovan daily  . Childhood asthma   . Bell's palsy   . Headache(784.0)     d/t Bells palsy and last headache 30months ago  . Peripheral neuropathy ([facility])     takes Lyrica daily  . Fibromyalgia   . Joint Swelling and Pain     knees,shoulders,elbows,and hands  . Back pain     pt states over 100 bones spurs on her left hip;scoliosis  . History of gastric ulcer   . Multiple allergies     uses Flonase daily as needed  . History  of colon polyps   . History of MRSA infection 2005    Reported in 2005. Also had I&D of groin abscess 05/2013 that grew MRSA.  Marland Kitchen History of shingles 2005  . Obstructive sleep apnea     a. mild OSA by 08/16/11 sleep study [facility]);  b. does not tolerate CPAP.   Past Surgical History  Procedure Laterality Date  . Nasal sinus surgery    . Appendectomy    . Cholecystectomy    . Incisional hernia repair    . Breast biopsy Left   . Cardiac catheterization  around 2007/2014    x 2  . Ganglion cyst removed Left   . Colonoscopy    . Esophagogastroduodenoscopy    . Multiple tooth extractions  05/21/2013    TOOTH #12  #19  WITH ALVEOLOPLASTY    . Multiple extractions with alveoloplasty N/A 05/21/2013    Procedure: Extraction of tooth #'s 12,19, and 30 with  alveoloplasty and gross debridement of remaining teeth;  Surgeon: [name], DDS;  Location: [place];  Service: Oral Surgery;  Laterality: N/A;   Family History  Problem Relation Age of Onset  . Hyperlipidemia Mother   . Hypertension Mother   . Heart disease Mother   . Hyperlipidemia Father   . Diabetes Father     DM 2   . Hypertension Father   . Heart disease Father    Social History  Substance Use Topics  . Smoking status: Current Every Day Smoker -- 0.25 packs/day for 38 years    Types: Cigarettes    Start date: 02/13/1974  . Smokeless tobacco: Never Used     Comment: 05/2013: currently smoking 1/3 ppd.  . Alcohol Use: No     Comment: rare   OB History    No data available     Review of Systems Constitutional: No fever or chill Respiratory: Recent pneumonia but no acute complaint of worsening shortness of breath or cough. Neurologic: No weakness numbness tingling or incoordination.   Allergies  Sulfa antibiotics and Adhesive  Home Medications   Prior to Admission medications   Medication Sig Start Date End Date Taking? Authorizing Provider  albuterol (PROVENTIL HFA;VENTOLIN HFA) 108 (90 BASE) MCG/ACT inhaler Inhale 2 puffs into the lungs every 6 (six) hours as needed for wheezing or shortness of breath.   Yes Historical Provider, MD  aspirin EC 81 MG tablet Take 81 mg by mouth daily.   Yes Historical Provider, MD  carvedilol (COREG) 25 MG tablet Take 25 mg by mouth 2 (two) times daily with a meal.   Yes Historical Provider, MD  clonazePAM (KLONOPIN) 1 MG tablet Take 1 mg by mouth at bedtime as needed for anxiety.   Yes Historical Provider, MD  cyclobenzaprine (FLEXERIL) 10 MG tablet Take 10 mg by mouth 3 (three) times daily as needed for muscle spasms.   Yes Historical Provider, MD  fluticasone (FLONASE) 50 MCG/ACT nasal spray Place 2 sprays into both nostrils at bedtime as needed for allergies.    Yes Historical Provider, MD  HYDROcodone-acetaminophen (NORCO)  10-325 MG per tablet Take 1 tablet by mouth every 6 (six) hours as needed for moderate pain.    Yes Historical Provider, MD  insulin aspart (NOVOLOG FLEXPEN) 100 UNIT/ML FlexPen Inject 6-36 Units into the skin 3 (three) times daily with meals. Sliding scale   Yes Historical Provider, MD  insulin glargine (LANTUS) 100 UNIT/ML injection Inject 80 Units into the skin 2 (two) times daily.  Yes Historical Provider, MD  Magnesium Oxide 250 MG TABS Take 1 tablet by mouth daily.   Yes Historical Provider, MD  nitroGLYCERIN (NITROSTAT) 0.4 MG SL tablet Place 0.4 mg under the tongue every 5 (five) minutes as needed for chest pain.   Yes Historical Provider, MD  omega-3 acid ethyl esters (LOVAZA) 1 G capsule Take 4 g by mouth daily.    Yes Historical Provider, MD  omeprazole (PRILOSEC) 40 MG capsule Take 40 mg by mouth daily.   Yes Historical Provider, MD  pregabalin (LYRICA) 150 MG capsule Take 150 mg by mouth every evening.    Yes Historical Provider, MD  rosuvastatin (CRESTOR) 40 MG tablet Take 40 mg by mouth daily.   Yes Historical Provider, MD  valsartan (DIOVAN) 160 MG tablet Take 160 mg by mouth daily.   Yes Historical Provider, MD  Vitamin D, Ergocalciferol, (DRISDOL) 50000 UNITS CAPS capsule Take 50,000 Units by mouth every 7 (seven) days. Tuesdays   Yes Historical Provider, MD   BP 145/76 mmHg  Pulse 75  Temp(Src) 98.5 F (36.9 C) (Oral)  Resp 20  SpO2 99% Physical Exam  Constitutional: She is oriented to person, place, and time.  Patient is moderately obese. She is alert and nontoxic. She does not have any acute respiratory distress. Mental status is clear.  HENT:  Right Ear: External ear normal.  Left Ear: External ear normal.  Nose: Nose normal.  Mouth/Throat: Oropharynx is clear and moist.  Right periorbital hematoma. Eye is open and extraocular motions are easily observed. 2 mm laceration to the left forehead. No active bleeding.  Eyes: Conjunctivae and EOM are normal. Pupils are  equal, round, and reactive to light.  Neck: Neck supple.  No C-spine tenderness.  Cardiovascular: Normal rate, regular rhythm, normal heart sounds and intact distal pulses.   Pulmonary/Chest: Effort normal and breath sounds normal.  Abdominal: Soft. Bowel sounds are normal. She exhibits no distension. There is no tenderness.  Musculoskeletal: Normal range of motion. She exhibits no edema or tenderness.  Neurological: She is alert and oriented to person, place, and time. She has normal strength. No cranial nerve deficit. She exhibits normal muscle tone. Coordination normal. GCS eye subscore is 4. GCS verbal subscore is 5. GCS motor subscore is 6.  Skin: Skin is warm, dry and intact.  Psychiatric: She has a normal mood and affect.    ED Course  Procedures (including critical care time) Labs Review Labs Reviewed - No data to display  Imaging Review Ct Head Wo Contrast  12/15/2014  CLINICAL DATA:  Right eye bruising and swelling after fall today on oxygen tank. Forehead abrasion. No reported loss of consciousness. EXAM: CT HEAD AND ORBITS WITHOUT CONTRAST TECHNIQUE: Contiguous axial images were obtained from the base of the skull through the vertex without contrast. Multidetector CT imaging of the orbits was performed using the standard protocol without intravenous contrast. COMPARISON:  CT scan of November 25, 2014. FINDINGS: CT HEAD FINDINGS Bony calvarium appears intact. No midline shift is noted. Ventricular size is within normal limits. There is no definite evidence of hemorrhage. There does appear to be subcortical white matter edema in the left posterior parietal region of unknown significance. This is new since prior exam. CT ORBITS FINDINGS No fracture or other bony abnormality is noted. Small mucous retention cyst is noted in left maxillary sinus. Mild right frontal and supraorbital scalp hematoma is noted. Globes appear normal. IMPRESSION: Mild right frontal and supraorbital scalp hematoma  is noted. No bony  abnormality is seen in the region of the orbits. Interval development of subcortical white matter edema in the left posterior parietal region. MRI of the brain is recommended to evaluate for infarction or other pathology. Electronically Signed   By: Marijo Conception, M.D.   On: 12/15/2014 09:20   Ct Orbitss W/o Cm  12/15/2014  CLINICAL DATA:  Right eye bruising and swelling after fall today on oxygen tank. Forehead abrasion. No reported loss of consciousness. EXAM: CT HEAD AND ORBITS WITHOUT CONTRAST TECHNIQUE: Contiguous axial images were obtained from the base of the skull through the vertex without contrast. Multidetector CT imaging of the orbits was performed using the standard protocol without intravenous contrast. COMPARISON:  CT scan of November 25, 2014. FINDINGS: CT HEAD FINDINGS Bony calvarium appears intact. No midline shift is noted. Ventricular size is within normal limits. There is no definite evidence of hemorrhage. There does appear to be subcortical white matter edema in the left posterior parietal region of unknown significance. This is new since prior exam. CT ORBITS FINDINGS No fracture or other bony abnormality is noted. Small mucous retention cyst is noted in left maxillary sinus. Mild right frontal and supraorbital scalp hematoma is noted. Globes appear normal. IMPRESSION: Mild right frontal and supraorbital scalp hematoma is noted. No bony abnormality is seen in the region of the orbits. Interval development of subcortical white matter edema in the left posterior parietal region. MRI of the brain is recommended to evaluate for infarction or other pathology. Electronically Signed   By: Marijo Conception, M.D.   On: 12/15/2014 09:20   I have personally reviewed and evaluated these images and lab results as part of my medical decision-making.   EKG Interpretation None     Consultation: Dr. Noemi Chapel at [facility] emergency department except for transfer for MRI based on CT  recommendation by radiology. MDM   Final diagnoses:  Head injury, initial encounter  Abnormal CT of brain  Periorbital hematoma of right eye   Patient had a fall without LOC but reports severe headache. No mental status change or focal neurologic deficit. Fall was mechanical in nature. Radiology interpretation of CT identifies a focus of abnormality that is new compared with a CT done at [facility] regional in the beginning of October. The recommendation is for MRI. At this point the patient will be transferred to [facility] emergency department for MRI as per radiology recommendations. At that point results to be reviewed by the receiving emergency department doctor and disposition made based on MRI results. At this point, the patient does not have neurologic dysfunction, mental status change or vital sign instability that would indicate need for emergent intervention. The patient has been given Tylenol for pain.    Charlesetta Shanks, MD 12/15/14 [phone]

## 2014-12-15 NOTE — ED Notes (Signed)
Revonda Humphrey, Charge RN, to advise Dr Sabra Heck MRI has resulted.

## 2014-12-15 NOTE — ED Notes (Signed)
Pt's son has arrived to room - aware pt in MRI.

## 2014-12-15 NOTE — ED Notes (Signed)
Wound cleaned with NS. Warm cloth applied to wound to loosen dried blood.

## 2015-01-14 IMAGING — CT CT CTA ABD/PEL W/CM AND/OR W/O CM
2 of 9 series · 14 of 46 positions shown, 16 images · IV contrast (omnipaque)
Comparison: 12/13/2010

CLINICAL DATA: Mitral regurgitation. Cardiomyopathy. Preop
planning.

EXAM:
CT ANGIOGRAPHY CHEST, ABDOMEN AND PELVIS
TECHNIQUE: Multidetector CT imaging through the chest, abdomen and pelvis was
performed using the standard protocol during bolus administration of
intravenous contrast. Multiplanar reconstructed images including
MIPs were obtained and reviewed to evaluate the vascular anatomy.
CONTRAST:  100mL OMNIPAQUE IOHEXOL 350 MG/ML SOLN

[Series 5: dissection 3.0 i30f 3 · axial · 0.81mm/px · z∈[-645,-78]mm · 11 of 215 slices shown, 13 images]
[im 13/215  soft-tissue]
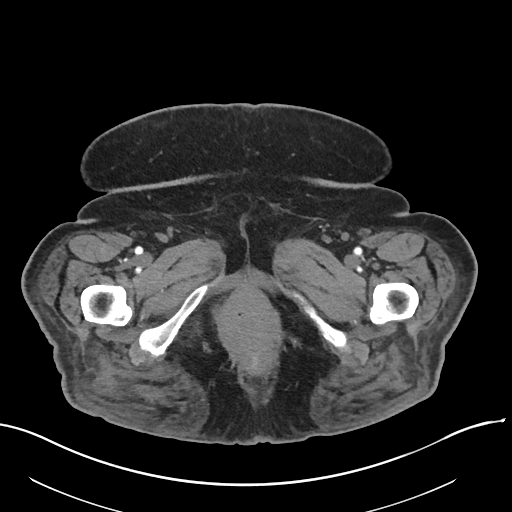
[im 13/215  bone]
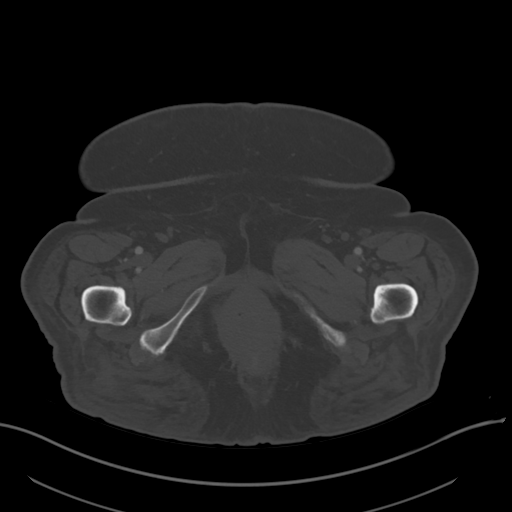
[im 38/215  soft-tissue]
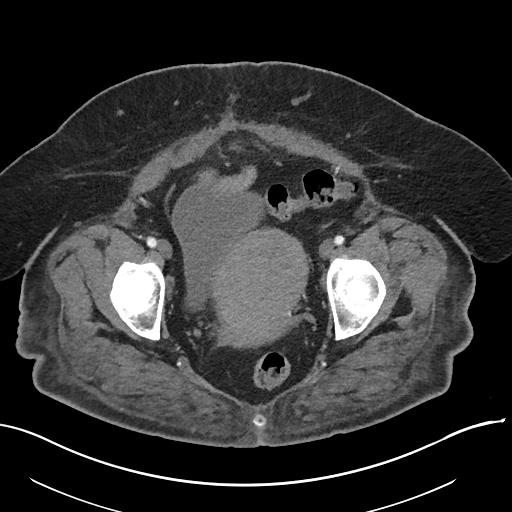
[im 51/215  soft-tissue]
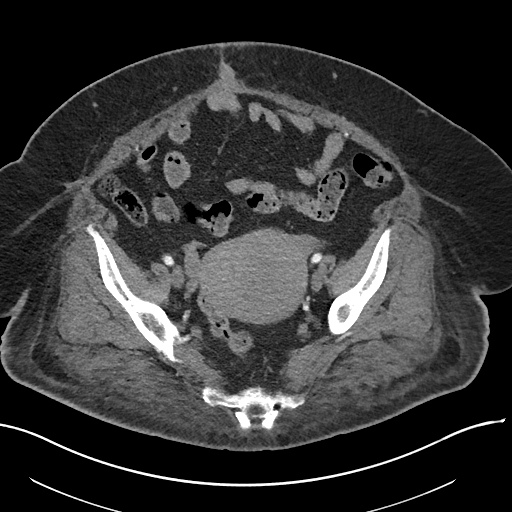
[im 76/215  soft-tissue]
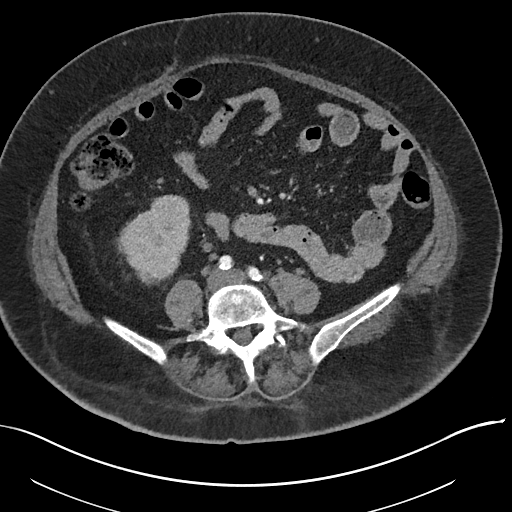
[im 89/215  soft-tissue]
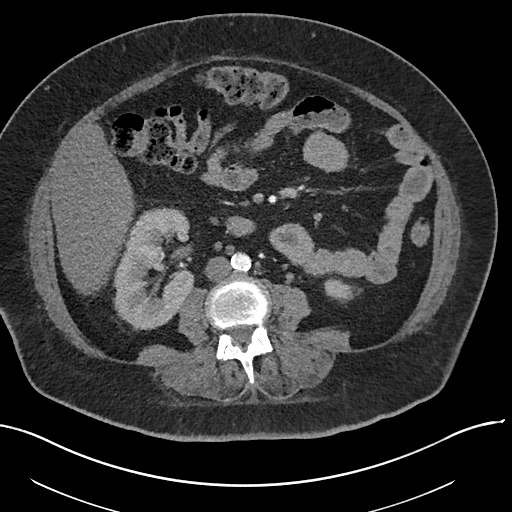
[im 114/215  soft-tissue]
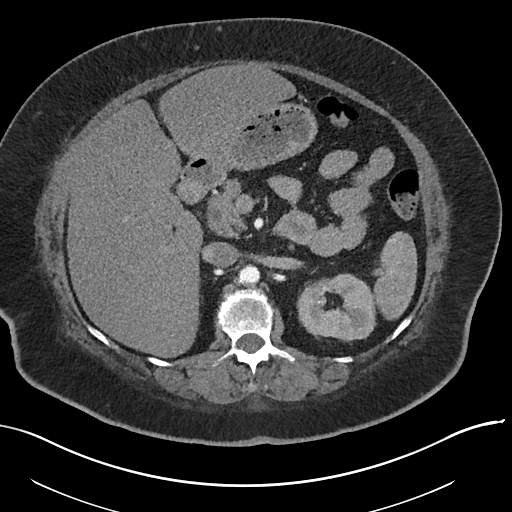
[im 126/215  soft-tissue]
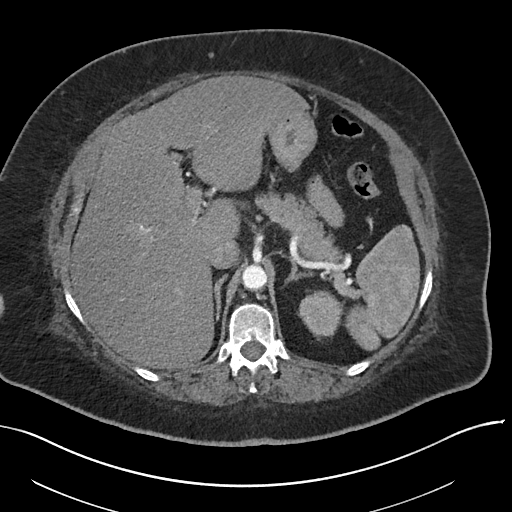
[im 139/215  soft-tissue]
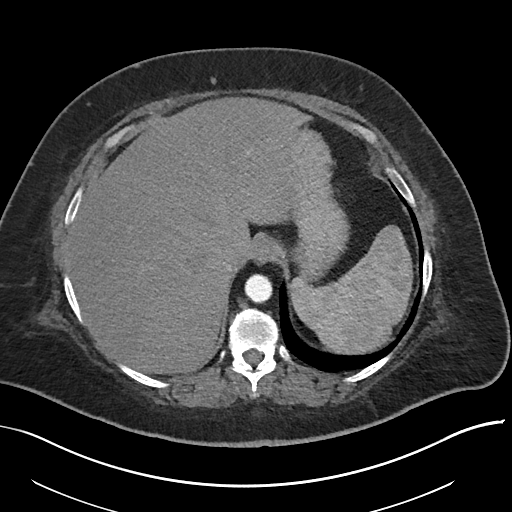
[im 164/215  soft-tissue]
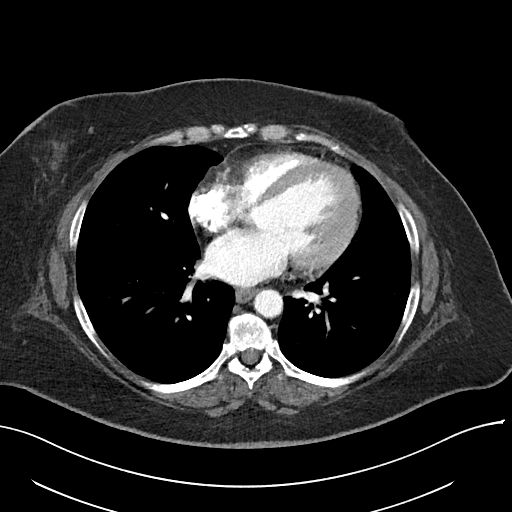
[im 164/215  bone]
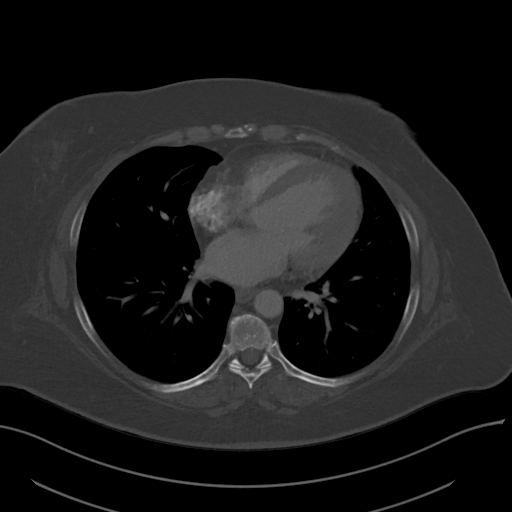
[im 177/215  soft-tissue]
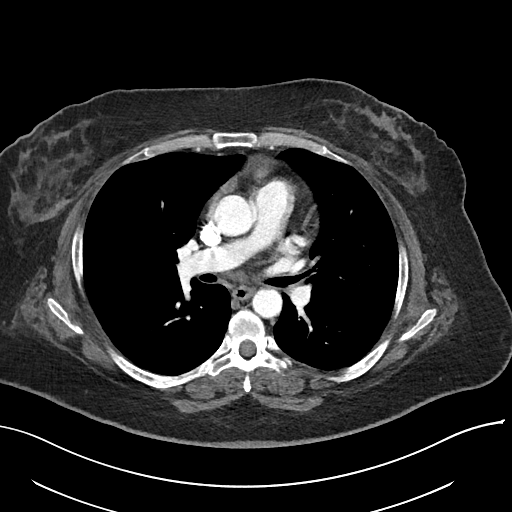
[im 202/215  soft-tissue]
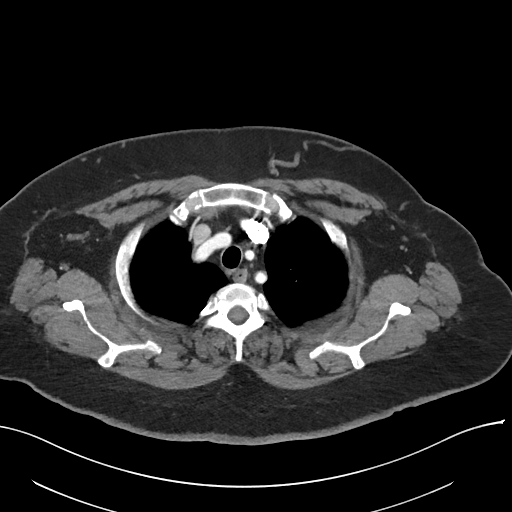

[Series 8: coronals · coronal · 0.92mm/px · 3 of 162 slices shown]
[im 41/162  soft-tissue]
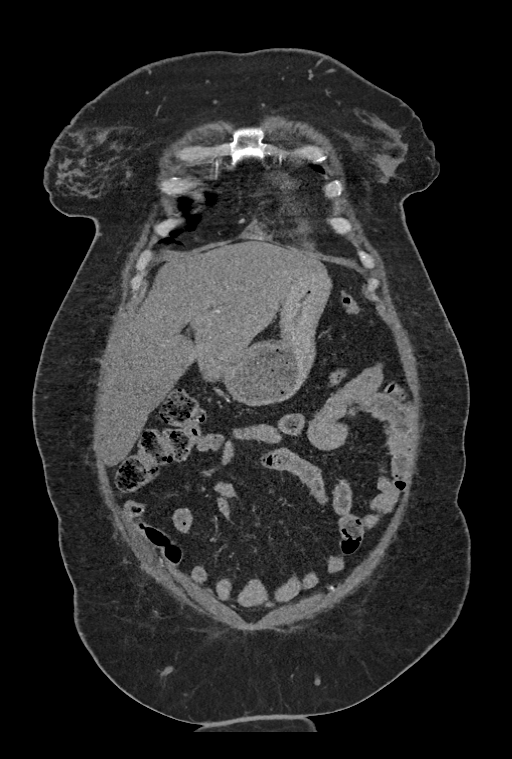
[im 81/162  soft-tissue]
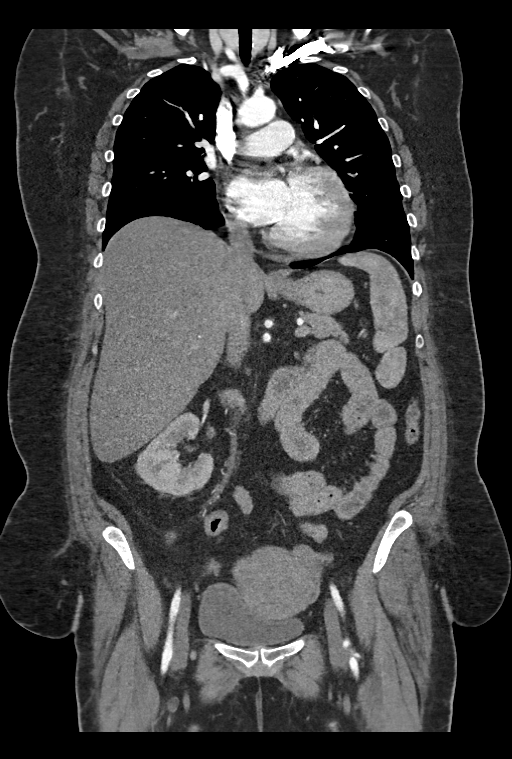
[im 121/162  soft-tissue]
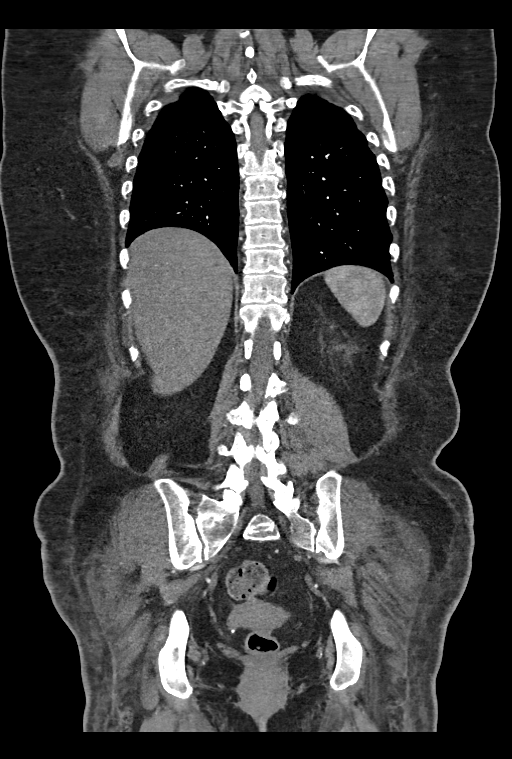

[14 of 46 positions shown; findings below may reference images not displayed]

FINDINGS: CTA CHEST FINDINGS

The non contrast scan shows no hyperdense crescent or mediastinal
hematoma. Mild patchy aortic calcifications. Small pericardial
effusion or pericardial thickening. No pleural effusion.

CTA demonstrates Satisfactory opacification of pulmonary arteries ,
and there is no evidence of pulmonary emboli. Adequate contrast
opacification of the thoracic aorta with no evidence of dissection,
aneurysm, or stenosis. There is classic 3-vessel brachiocephalic
arch anatomy without proximal stenosis.

No hilar or mediastinal adenopathy. Stable bibasilar linear
scarring/atelectasis. Lungs otherwise clear. Degenerative disc
disease at multiple contiguous levels in the lower thoracic spine.

Review of the MIP images confirms the above findings.

CTA ABDOMEN AND PELVIS FINDINGS

Arterial findings:

Aorta: Eccentric nonocclusive mural thrombus in the juxtarenal and
infrarenal segments with partially calcified moderate calcified
plaque in the infrarenal segment. No dissection, aneurysm, or
stenosis.

Celiac axis:         Patent

Superior mesenteric: Patent

Left renal:          Single, patent

Right renal:         Single, patent

Inferior mesenteric: Patent

Left iliac: Partially calcified eccentric nonocclusive plaque
through the length of the common iliac artery which remains patent,
plaque extending into the proximal internal iliac artery resulting
in mild stenosis. External iliac artery patent.

Right iliac: Calcified eccentric plaque through the common iliac
without stenosis. Proximal partially calcified plaque in the
internal iliac artery without significant stenosis. External iliac
patent.

Venous findings: Venous phase imaging not obtained. Dilated
bilateral gonadal veins noted.

Review of the MIP images confirms the above findings.

Nonvascular findings: Unremarkable arterial phase evaluation of
liver, spleen, adrenal glands, kidneys, pancreas. Surgical clips in
the gallbladder fossa. Stomach, small bowel, and colon are
nondilated. Appendix not visualized. Globular uterus. Unremarkable
adnexal regions. Urinary bladder physiologically distended. No
ascites. No free air. Subcentimeter left para-aortic and aortocaval
lymph nodes. No mesenteric or pelvic adenopathy. Degenerative disc
disease L3-4 and L4-5.
IMPRESSION: 1. Negative for acute PE, aortic dissection or aneurysm.
2. Nonocclusive partially calcified plaque through the infrarenal
abdominal aorta and bilateral common iliac arteries.

## 2015-08-12 ENCOUNTER — Encounter: Payer: Self-pay | Admitting: *Deleted

## 2015-08-24 NOTE — Progress Notes (Signed)
Cardiology Office Note    Date:  08/26/2015   ID:  Audrey Olson, DOB 10-Nov-1960, MRN [id number]  PCP:  Glendon Axe, MD  Cardiologist:  Kirk Ruths, MD     History of Present Illness:  Audrey Olson is a 55 y.o. female for evaluation of CHF. Carotid Dopplers July 2014 showed less than 30% right internal carotid artery stenosis. Cardiac catheterization October 2014 showed normal coronary arteries and ejection fraction 35%. Transesophageal echocardiogram at [facility] in January 2015 showed global LV dysfunction with at least moderate-severe mitral regurgitation. Patient was seen by Dr. Roxy Manns for consideration of mitral valve surgery but MR was felt to be likely functional. Echocardiogram March 2015 showed ejection fraction of 25-30%, restrictive filling, trace aortic and mitral regurgitation and moderate left atrial enlargement. Patient seen by Dr. Rayann Heman in July 2015 for consideration of ICD. She did not make a final decision. He recommended a subcutaneous ICD if she wanted to proceed. ABIs December 2016 normal. Echocardiogram repeated in [facility] in April 2017. By report there is mild left ventricular enlargement, ejection fraction 20-25%, mild mitral, aortic and tricuspid regurgitation and small pericardial effusion. Nuclear study April 2017 in [facility] showed ejection fraction 27%. The low septal attenuation versus scar but no ischemia.  Laboratories June 2017 showed BUN 33 and creatinine 2.44. Patient has been referred to nephrology. Patient notes dyspnea on exertion but no orthopnea. She states she has occasional pedal edema. She has occasional pains in her chest without exertion. They are sharp. She denies exertional chest pain. She denies syncope.    Past Medical History  Diagnosis Date  . Lumbar spondylosis   . Hyperlipidemia     takes Crestor daily as well as Lovaza  . Acid reflux   . Restless legs syndrome (RLS)   . Vitamin D deficiency     takes Vit D  50000 units weekly  . History of rectal bleeding   . Hepatomegaly     With splenomegaly  . Unspecified urinary incontinence   . Degeneration of lumbar or lumbosacral intervertebral disc   . Fatty pancreas ([place])   . Internal hemorrhoids   . LVH (left ventricular hypertrophy)   . Generalized anxiety disorder   . Anemia   . Hyponatremia   . Disorders of magnesium metabolism   . Alpha-1-antitrypsin deficiency ([place])     pt is not aware of this diagnosis  . Hemangioma of spleen   . Chronic combined systolic and diastolic CHF (congestive heart failure) ([place])     a. 04/2013 Echo: EF 25-30%.  . Non-ischemic cardiomyopathy ([place])     a. 04/2013 Echo: EF 25-30%, glob HK, basal and mid antsept/infersept/ant AK, Grade 4 DD, Triv AI/MR, mod dil LA.  . Mitral regurgitation     a. Prev noted to be mod-sev by TEE 02/2013;  b. 04/2013 Echo: EF 25-30%, triv MR.  . Diabetic neuropathy ([place])   . Chronic pain   . Tobacco abuse   . Severe obesity (BMI 35.0-39.9) with comorbidity ([place])   . Arthritis     Lower back and both hips with severe chronic pain and limited mobility  . Osteoporosis   . COPD (chronic obstructive pulmonary disease) (HCC)     uses Albuterol daily as needed  . Panic attacks     takes Klonopin nightly  . Anxiety   . Type II diabetes mellitus ([place])     Insulin-dependent since 2008 - takes Novolog and Lantus daily as well as Metformin  .  Hypertension     takes Diovan daily  . Childhood asthma   . Bell's palsy   . Headache(784.0)     d/t Bells palsy and last headache 68months ago  . Peripheral neuropathy (HCC)     takes Lyrica daily  . Fibromyalgia   . Joint Swelling and Pain     knees,shoulders,elbows,and hands  . Back pain     pt states over 100 bones spurs on her left hip;scoliosis  . History of gastric ulcer   . Multiple allergies     uses Flonase daily as needed  . History of colon polyps   . History of MRSA infection 2005    Reported in 2005. Also had I&D of groin  abscess 05/2013 that grew MRSA.  Marland Kitchen History of shingles 2005  . Obstructive sleep apnea     a. mild OSA by 08/16/11 sleep study [facility]);  b. does not tolerate CPAP.    Past Surgical History  Procedure Laterality Date  . Nasal sinus surgery    . Appendectomy    . Cholecystectomy    . Cholecystectomy    . Breast biopsy Left   . Cardiac catheterization  around 2007/2014    x 2  . Ganglion cyst removed Left   . Colonoscopy    . Esophagogastroduodenoscopy    . Multiple tooth extractions  05/21/2013    TOOTH #12  #19  WITH ALVEOLOPLASTY    . Multiple extractions with alveoloplasty N/A 05/21/2013    Procedure: Extraction of tooth #'s 12,19, and 30 with alveoloplasty and gross debridement of remaining teeth;  Surgeon: [name], DDS;  Location: [place];  Service: Oral Surgery;  Laterality: N/A;    Current Medications: Outpatient Prescriptions Prior to Visit  Medication Sig Dispense Refill  . albuterol (PROVENTIL HFA;VENTOLIN HFA) 108 (90 BASE) MCG/ACT inhaler Inhale 2 puffs into the lungs every 6 (six) hours as needed for wheezing or shortness of breath.    Marland Kitchen aspirin EC 81 MG tablet Take 81 mg by mouth daily.    . carvedilol (COREG) 25 MG tablet Take 25 mg by mouth 2 (two) times daily with a meal.    . clonazePAM (KLONOPIN) 1 MG tablet Take 1 mg by mouth at bedtime as needed for anxiety.    . cyclobenzaprine (FLEXERIL) 10 MG tablet Take 5 mg by mouth 3 (three) times daily as needed for muscle spasms.     Marland Kitchen HYDROcodone-acetaminophen (NORCO) 10-325 MG per tablet Take 1 tablet by mouth every 6 (six) hours as needed for moderate pain.     Marland Kitchen insulin aspart (NOVOLOG FLEXPEN) 100 UNIT/ML FlexPen Inject 6-36 Units into the skin 3 (three) times daily with meals. Sliding scale    . insulin glargine (LANTUS) 100 UNIT/ML injection Inject 10 Units into the skin 2 (two) times daily.     . Magnesium Oxide 250 MG TABS Take 1 tablet by mouth daily.    . nitroGLYCERIN (NITROSTAT) 0.4 MG SL  tablet Place 0.4 mg under the tongue every 5 (five) minutes as needed for chest pain.    Marland Kitchen omega-3 acid ethyl esters (LOVAZA) 1 G capsule Take 4 g by mouth daily.     Marland Kitchen omeprazole (PRILOSEC) 40 MG capsule Take 40 mg by mouth daily.    . pregabalin (LYRICA) 150 MG capsule Take 150 mg by mouth every evening.     . rosuvastatin (CRESTOR) 40 MG tablet Take 40 mg by mouth daily.    . Vitamin D, Ergocalciferol, (  DRISDOL) 50000 UNITS CAPS capsule Take 50,000 Units by mouth every 7 (seven) days. Tuesdays    . fluticasone (FLONASE) 50 MCG/ACT nasal spray Place 2 sprays into both nostrils at bedtime as needed for allergies.      No facility-administered medications prior to visit.     Allergies:   Sulfa antibiotics; Adhesive; Ropinirole; and Ibuprofen   Social History   Social History  . Marital Status: Divorced    Spouse Name: N/A  . Number of Children: 2  . Years of Education: N/A   Social History Main Topics  . Smoking status: Current Every Day Smoker -- 0.25 packs/day for 38 years    Types: Cigarettes    Start date: 02/13/1974  . Smokeless tobacco: Never Used     Comment: 05/2013: currently smoking 1/3 ppd.  . Alcohol Use: No     Comment: rare  . Drug Use: No  . Sexual Activity: Not Currently    Birth Control/ Protection: Post-menopausal   Other Topics Concern  . None   Social History Narrative   Lives in [place] with sister.  Unemployed     Family History:  The patient's family history includes Diabetes Mellitus II in her father; Heart disease in her father and mother; Hyperlipidemia in her father and mother; Hypertension in her father and mother; Lung disease (age of onset: 67) in her mother.   ROS:   Please see the history of present illness.  Positive cough and arthralgias but no weight loss, hemoptysis, dysphagia, odynophagia, melena, hematochezia, dysuria, hematuria, rash, seizure activity, orthopnea, PND, claudication. All remaining systems negative.   PHYSICAL  EXAM:   VS:  BP 152/78 mmHg  Pulse 72  Ht 5\' 2"  (1.575 m)  Wt 222 lb (100.699 kg)  BMI 40.59 kg/m2   GEN: Well nourished, obese in no acute distress HEENT: normal Neck: no JVD, carotid bruits, or masses Cardiac: RRR; no murmurs, rubs, or gallops,no edema  Respiratory:  clear to auscultation bilaterally, normal work of breathing GI: soft, nontender, nondistended, + BS MS: no deformity or atrophy Skin: warm and dry, no rash Neuro:  Alert and Oriented x 3, Strength and sensation are intact Psych: euthymic mood, full affect  Wt Readings from Last 3 Encounters:  08/26/15 222 lb (100.699 kg)  09/10/13 221 lb (100.245 kg)  06/17/13 228 lb 6.4 oz (103.602 kg)       EKG:  EKG -sinus, Septal infarct, left ventricular hypertrophy with lateral T-wave inversion.   A/P  1 Nonischemic cardiomyopathy- Etiology of cardiomyopathy unclear. Continue carvedilol. Patient states she did not tolerate entresto. I will avoid ACE inhibitors and ARB's at this point given severity of renal insufficiency. We can consider this in the future if nephrology feels she would tolerate. For now I will add hydralazine 25 mg by mouth 3 times a day and isosorbide 30 mg daily. Titrate medications as tolerated by blood pressure. Once fully titrated repeat echocardiogram. Ejection fraction less than 35% will refer back to electrophysiology for consideration of ICD.  Approximately 30 minutes spent reviewing previous records prior to patient visit.   2 history of mitral regurgitation-mild by report on most recent echocardiogram. She will need follow-up studies in the future.  3 hypertension-blood pressure controlled. Continue present medications.  4 stage IV chronic renal disease-patient has been referred to nephrology.  5 hyperlipidemia-continue statin.  6 chronic systolic congestive heart failure-continue present dose of Lasix. I will not add spironolactone given severity of renal insufficiency.  7 tobacco  abuse-patient  counseled on discontinuing.    Medication Adjustments/Labs and Tests Ordered: Current medicines are reviewed at length with the patient today.  Concerns regarding medicines are outlined above.  Medication changes, Labs and Tests ordered today are listed in the Patient Instructions below. There are no Patient Instructions on file for this visit.   Signed, Kirk Ruths, MD  08/26/2015 3:16 PM    [place]

## 2015-08-26 ENCOUNTER — Ambulatory Visit (INDEPENDENT_AMBULATORY_CARE_PROVIDER_SITE_OTHER): Payer: Medicaid Other | Admitting: Cardiology

## 2015-08-26 ENCOUNTER — Encounter: Payer: Self-pay | Admitting: Cardiology

## 2015-08-26 VITALS — BP 152/78 | HR 72 | Ht 62.0 in | Wt 222.0 lb

## 2015-08-26 DIAGNOSIS — I429 Cardiomyopathy, unspecified: Secondary | ICD-10-CM

## 2015-08-26 DIAGNOSIS — I5022 Chronic systolic (congestive) heart failure: Secondary | ICD-10-CM

## 2015-08-26 DIAGNOSIS — I5042 Chronic combined systolic (congestive) and diastolic (congestive) heart failure: Secondary | ICD-10-CM | POA: Diagnosis not present

## 2015-08-26 DIAGNOSIS — E785 Hyperlipidemia, unspecified: Secondary | ICD-10-CM | POA: Diagnosis not present

## 2015-08-26 DIAGNOSIS — I428 Other cardiomyopathies: Secondary | ICD-10-CM

## 2015-08-26 DIAGNOSIS — I10 Essential (primary) hypertension: Secondary | ICD-10-CM

## 2015-08-26 DIAGNOSIS — Z720 Tobacco use: Secondary | ICD-10-CM

## 2015-08-26 MED ORDER — ISOSORBIDE MONONITRATE ER 30 MG PO TB24: 30.0000 mg | ORAL_TABLET | Freq: Every day | ORAL | Status: DC

## 2015-08-26 MED ORDER — HYDRALAZINE HCL 25 MG PO TABS: 25.0000 mg | ORAL_TABLET | Freq: Three times a day (TID) | ORAL | Status: DC

## 2015-08-26 NOTE — Patient Instructions (Addendum)
Medications  START TAKING HYDRALAZINE 25MG , 3 TIMES DAILY  START IMDUR 30MG  ONCE DAILY    Follow Up  Your physician wants you to follow-up in: 6 TO 8 WEEKS BY DR CRENSHAW IN [place]. You will receive a reminder letter in the mail two months in advance. If you don't receive a letter, please call our office to schedule the follow-up appointment.

## 2015-10-20 NOTE — Progress Notes (Deleted)
HPI: FU CHF. Carotid Dopplers July 2014 showed less than 30% right internal carotid artery stenosis. Cardiac catheterization October 2014 showed normal coronary arteries and ejection fraction 35%. Transesophageal echocardiogram at [facility] in January 2015 showed global LV dysfunction with at least moderate-severe mitral regurgitation. Patient was seen by Dr. Roxy Manns for consideration of mitral valve surgery but MR was felt to be likely functional. Echocardiogram March 2015 showed ejection fraction of 25-30%, restrictive filling, trace aortic and mitral regurgitation and moderate left atrial enlargement. Patient seen by Dr. Rayann Heman in July 2015 for consideration of ICD. She did not make a final decision. He recommended a subcutaneous ICD if she wanted to proceed. ABIs December 2016 normal. Echocardiogram repeated in [facility] in April 2017. By report there is mild left ventricular enlargement, ejection fraction 20-25%, mild mitral, aortic and tricuspid regurgitation and small pericardial effusion. Nuclear study April 2017 in [facility] showed ejection fraction 27%. The low septal attenuation versus scar but no ischemia.  Also with renal insufficiency. Since last seen,   Current Outpatient Prescriptions  Medication Sig Dispense Refill  . albuterol (PROVENTIL HFA;VENTOLIN HFA) 108 (90 BASE) MCG/ACT inhaler Inhale 2 puffs into the lungs every 6 (six) hours as needed for wheezing or shortness of breath.    Marland Kitchen aspirin EC 81 MG tablet Take 81 mg by mouth daily.    . carvedilol (COREG) 25 MG tablet Take 25 mg by mouth 2 (two) times daily with a meal.    . clonazePAM (KLONOPIN) 1 MG tablet Take 1 mg by mouth at bedtime as needed for anxiety.    . cyclobenzaprine (FLEXERIL) 10 MG tablet Take 5 mg by mouth 3 (three) times daily as needed for muscle spasms.     . hydrALAZINE (APRESOLINE) 25 MG tablet Take 1 tablet (25 mg total) by mouth 3 (three) times daily. 270 tablet 3  .  HYDROcodone-acetaminophen (NORCO) 10-325 MG per tablet Take 1 tablet by mouth every 6 (six) hours as needed for moderate pain.     Marland Kitchen insulin aspart (NOVOLOG FLEXPEN) 100 UNIT/ML FlexPen Inject 6-36 Units into the skin 3 (three) times daily with meals. Sliding scale    . insulin glargine (LANTUS) 100 UNIT/ML injection Inject 10 Units into the skin 2 (two) times daily.     . isosorbide mononitrate (IMDUR) 30 MG 24 hr tablet Take 1 tablet (30 mg total) by mouth daily. 90 tablet 3  . Magnesium Oxide 250 MG TABS Take 1 tablet by mouth daily.    . nitroGLYCERIN (NITROSTAT) 0.4 MG SL tablet Place 0.4 mg under the tongue every 5 (five) minutes as needed for chest pain.    Marland Kitchen omega-3 acid ethyl esters (LOVAZA) 1 G capsule Take 4 g by mouth daily.     Marland Kitchen omeprazole (PRILOSEC) 40 MG capsule Take 40 mg by mouth daily.    . pregabalin (LYRICA) 150 MG capsule Take 150 mg by mouth every evening.     . rosuvastatin (CRESTOR) 40 MG tablet Take 40 mg by mouth daily.    . Vitamin D, Ergocalciferol, (DRISDOL) 50000 UNITS CAPS capsule Take 50,000 Units by mouth every 7 (seven) days. Tuesdays     No current facility-administered medications for this visit.      Past Medical History:  Diagnosis Date  . Acid reflux   . Alpha-1-antitrypsin deficiency ([place])    pt is not aware of this diagnosis  . Anemia   . Anxiety   . Arthritis  Lower back and both hips with severe chronic pain and limited mobility  . Back pain    pt states over 100 bones spurs on her left hip;scoliosis  . Bell's palsy   . Childhood asthma   . Chronic combined systolic and diastolic CHF (congestive heart failure) ([place])    a. 04/2013 Echo: EF 25-30%.  . Chronic pain   . COPD (chronic obstructive pulmonary disease) (HCC)    uses Albuterol daily as needed  . Degeneration of lumbar or lumbosacral intervertebral disc   . Diabetic neuropathy ([place])   . Disorders of magnesium metabolism   . Fatty pancreas ([place])   . Fibromyalgia   . Generalized  anxiety disorder   . Headache(784.0)    d/t Bells palsy and last headache 59months ago  . Hemangioma of spleen   . Hepatomegaly    With splenomegaly  . History of colon polyps   . History of gastric ulcer   . History of MRSA infection 2005   Reported in 2005. Also had I&D of groin abscess 05/2013 that grew MRSA.  Marland Kitchen History of rectal bleeding   . History of shingles 2005  . Hyperlipidemia    takes Crestor daily as well as Lovaza  . Hypertension    takes Diovan daily  . Hyponatremia   . Internal hemorrhoids   . Joint Swelling and Pain    knees,shoulders,elbows,and hands  . Lumbar spondylosis   . LVH (left ventricular hypertrophy)   . Mitral regurgitation    a. Prev noted to be mod-sev by TEE 02/2013;  b. 04/2013 Echo: EF 25-30%, triv MR.  . Multiple allergies    uses Flonase daily as needed  . Non-ischemic cardiomyopathy ([place])    a. 04/2013 Echo: EF 25-30%, glob HK, basal and mid antsept/infersept/ant AK, Grade 4 DD, Triv AI/MR, mod dil LA.  Marland Kitchen Obstructive sleep apnea    a. mild OSA by 08/16/11 sleep study [facility]);  b. does not tolerate CPAP.  Marland Kitchen Osteoporosis   . Panic attacks    takes Klonopin nightly  . Peripheral neuropathy (HCC)    takes Lyrica daily  . Restless legs syndrome (RLS)   . Severe obesity (BMI 35.0-39.9) with comorbidity ([place])   . Tobacco abuse   . Type II diabetes mellitus ([place])    Insulin-dependent since 2008 - takes Novolog and Lantus daily as well as Metformin  . Unspecified urinary incontinence   . Vitamin D deficiency    takes Vit D 50000 units weekly    Past Surgical History:  Procedure Laterality Date  . APPENDECTOMY    . BREAST BIOPSY Left   . CARDIAC CATHETERIZATION  around 2007/2014   x 2  . CHOLECYSTECTOMY    . CHOLECYSTECTOMY    . COLONOSCOPY    . ESOPHAGOGASTRODUODENOSCOPY    . ganglion cyst removed Left   . MULTIPLE EXTRACTIONS WITH ALVEOLOPLASTY N/A 05/21/2013   Procedure: Extraction of tooth #'s 12,19, and 30 with  alveoloplasty and gross debridement of remaining teeth;  Surgeon: [name], DDS;  Location: [place];  Service: Oral Surgery;  Laterality: N/A;  . MULTIPLE TOOTH EXTRACTIONS  05/21/2013   TOOTH #12  #19  WITH ALVEOLOPLASTY    . NASAL SINUS SURGERY      Social History   Social History  . Marital status: Divorced    Spouse name: N/A  . Number of children: 2  . Years of education: N/A   Occupational History  . Not on file.  Social History Main Topics  . Smoking status: Current Every Day Smoker    Packs/day: 0.25    Years: 38.00    Types: Cigarettes    Start date: 02/13/1974  . Smokeless tobacco: Never Used     Comment: 05/2013: currently smoking 1/3 ppd.  . Alcohol use No     Comment: rare  . Drug use: No  . Sexual activity: Not Currently    Birth control/ protection: Post-menopausal   Other Topics Concern  . Not on file   Social History Narrative   Lives in [place] with sister.  Unemployed    Family History  Problem Relation Age of Onset  . Hyperlipidemia Mother   . Hypertension Mother   . Lung disease Mother 50  . Hyperlipidemia Father   . Diabetes Mellitus II Father   . Hypertension Father   . Cancer Father     ROS: no fevers or chills, productive cough, hemoptysis, dysphasia, odynophagia, melena, hematochezia, dysuria, hematuria, rash, seizure activity, orthopnea, PND, pedal edema, claudication. Remaining systems are negative.  Physical Exam: Well-developed well-nourished in no acute distress.  Skin is warm and dry.  HEENT is normal.  Neck is supple.  Chest is clear to auscultation with normal expansion.  Cardiovascular exam is regular rate and rhythm.  Abdominal exam nontender or distended. No masses palpated. Extremities show no edema. neuro grossly intact  ECG

## 2015-10-27 ENCOUNTER — Ambulatory Visit: Payer: Self-pay | Admitting: Cardiology

## 2018-07-05 ENCOUNTER — Inpatient Hospital Stay (HOSPITAL_COMMUNITY)
Admission: EM | Admit: 2018-07-05 | Discharge: 2018-07-07 | DRG: 638 | Disposition: A | Discharge status: Home / Self Care | Payer: Medicaid Other | Source: Ambulatory Visit | Attending: Internal Medicine | Admitting: Internal Medicine

## 2018-07-05 ENCOUNTER — Encounter (HOSPITAL_COMMUNITY): Payer: Self-pay | Admitting: Pharmacy Technician

## 2018-07-05 ENCOUNTER — Emergency Department (HOSPITAL_COMMUNITY): Payer: Medicaid Other

## 2018-07-05 ENCOUNTER — Other Ambulatory Visit: Payer: Self-pay

## 2018-07-05 DIAGNOSIS — E11649 Type 2 diabetes mellitus with hypoglycemia without coma: Principal | ICD-10-CM | POA: Diagnosis present

## 2018-07-05 DIAGNOSIS — Z888 Allergy status to other drugs, medicaments and biological substances status: Secondary | ICD-10-CM

## 2018-07-05 DIAGNOSIS — M1990 Unspecified osteoarthritis, unspecified site: Secondary | ICD-10-CM | POA: Diagnosis present

## 2018-07-05 DIAGNOSIS — E1100 Type 2 diabetes mellitus with hyperosmolarity without nonketotic hyperglycemic-hyperosmolar coma (NKHHC): Secondary | ICD-10-CM

## 2018-07-05 DIAGNOSIS — Y92531 Health care provider office as the place of occurrence of the external cause: Secondary | ICD-10-CM

## 2018-07-05 DIAGNOSIS — I10 Essential (primary) hypertension: Secondary | ICD-10-CM | POA: Diagnosis present

## 2018-07-05 DIAGNOSIS — Z882 Allergy status to sulfonamides status: Secondary | ICD-10-CM

## 2018-07-05 DIAGNOSIS — F419 Anxiety disorder, unspecified: Secondary | ICD-10-CM | POA: Diagnosis not present

## 2018-07-05 DIAGNOSIS — E1140 Type 2 diabetes mellitus with diabetic neuropathy, unspecified: Secondary | ICD-10-CM | POA: Diagnosis present

## 2018-07-05 DIAGNOSIS — Z91048 Other nonmedicinal substance allergy status: Secondary | ICD-10-CM

## 2018-07-05 DIAGNOSIS — S0101XA Laceration without foreign body of scalp, initial encounter: Secondary | ICD-10-CM | POA: Diagnosis present

## 2018-07-05 DIAGNOSIS — G4733 Obstructive sleep apnea (adult) (pediatric): Secondary | ICD-10-CM | POA: Diagnosis present

## 2018-07-05 DIAGNOSIS — E160 Drug-induced hypoglycemia without coma: Secondary | ICD-10-CM | POA: Diagnosis present

## 2018-07-05 DIAGNOSIS — Z6841 Body Mass Index (BMI) 40.0 and over, adult: Secondary | ICD-10-CM

## 2018-07-05 DIAGNOSIS — M810 Age-related osteoporosis without current pathological fracture: Secondary | ICD-10-CM | POA: Diagnosis present

## 2018-07-05 DIAGNOSIS — K08409 Partial loss of teeth, unspecified cause, unspecified class: Secondary | ICD-10-CM | POA: Diagnosis present

## 2018-07-05 DIAGNOSIS — W19XXXA Unspecified fall, initial encounter: Secondary | ICD-10-CM | POA: Diagnosis present

## 2018-07-05 DIAGNOSIS — Z833 Family history of diabetes mellitus: Secondary | ICD-10-CM

## 2018-07-05 DIAGNOSIS — Z9049 Acquired absence of other specified parts of digestive tract: Secondary | ICD-10-CM

## 2018-07-05 DIAGNOSIS — Z8349 Family history of other endocrine, nutritional and metabolic diseases: Secondary | ICD-10-CM

## 2018-07-05 DIAGNOSIS — N2581 Secondary hyperparathyroidism of renal origin: Secondary | ICD-10-CM | POA: Diagnosis present

## 2018-07-05 DIAGNOSIS — Z8711 Personal history of peptic ulcer disease: Secondary | ICD-10-CM

## 2018-07-05 DIAGNOSIS — Z1159 Encounter for screening for other viral diseases: Secondary | ICD-10-CM

## 2018-07-05 DIAGNOSIS — E162 Hypoglycemia, unspecified: Secondary | ICD-10-CM

## 2018-07-05 DIAGNOSIS — Z9115 Patient's noncompliance with renal dialysis: Secondary | ICD-10-CM

## 2018-07-05 DIAGNOSIS — G2581 Restless legs syndrome: Secondary | ICD-10-CM | POA: Diagnosis present

## 2018-07-05 DIAGNOSIS — R55 Syncope and collapse: Secondary | ICD-10-CM | POA: Diagnosis not present

## 2018-07-05 DIAGNOSIS — I5042 Chronic combined systolic (congestive) and diastolic (congestive) heart failure: Secondary | ICD-10-CM | POA: Diagnosis present

## 2018-07-05 DIAGNOSIS — I132 Hypertensive heart and chronic kidney disease with heart failure and with stage 5 chronic kidney disease, or end stage renal disease: Secondary | ICD-10-CM | POA: Diagnosis present

## 2018-07-05 DIAGNOSIS — S0990XA Unspecified injury of head, initial encounter: Secondary | ICD-10-CM

## 2018-07-05 DIAGNOSIS — F17210 Nicotine dependence, cigarettes, uncomplicated: Secondary | ICD-10-CM | POA: Diagnosis present

## 2018-07-05 DIAGNOSIS — D649 Anemia, unspecified: Secondary | ICD-10-CM | POA: Diagnosis present

## 2018-07-05 DIAGNOSIS — Z794 Long term (current) use of insulin: Secondary | ICD-10-CM

## 2018-07-05 DIAGNOSIS — Z8249 Family history of ischemic heart disease and other diseases of the circulatory system: Secondary | ICD-10-CM

## 2018-07-05 DIAGNOSIS — Z992 Dependence on renal dialysis: Secondary | ICD-10-CM

## 2018-07-05 DIAGNOSIS — Z79899 Other long term (current) drug therapy: Secondary | ICD-10-CM

## 2018-07-05 DIAGNOSIS — I428 Other cardiomyopathies: Secondary | ICD-10-CM | POA: Diagnosis present

## 2018-07-05 DIAGNOSIS — J449 Chronic obstructive pulmonary disease, unspecified: Secondary | ICD-10-CM | POA: Diagnosis present

## 2018-07-05 DIAGNOSIS — E559 Vitamin D deficiency, unspecified: Secondary | ICD-10-CM | POA: Diagnosis present

## 2018-07-05 DIAGNOSIS — Z87892 Personal history of anaphylaxis: Secondary | ICD-10-CM

## 2018-07-05 DIAGNOSIS — N186 End stage renal disease: Secondary | ICD-10-CM | POA: Diagnosis not present

## 2018-07-05 DIAGNOSIS — E7800 Pure hypercholesterolemia, unspecified: Secondary | ICD-10-CM | POA: Diagnosis present

## 2018-07-05 DIAGNOSIS — E785 Hyperlipidemia, unspecified: Secondary | ICD-10-CM | POA: Diagnosis present

## 2018-07-05 DIAGNOSIS — E1122 Type 2 diabetes mellitus with diabetic chronic kidney disease: Secondary | ICD-10-CM | POA: Diagnosis present

## 2018-07-05 DIAGNOSIS — Z720 Tobacco use: Secondary | ICD-10-CM | POA: Diagnosis present

## 2018-07-05 DIAGNOSIS — M898X9 Other specified disorders of bone, unspecified site: Secondary | ICD-10-CM | POA: Diagnosis present

## 2018-07-05 DIAGNOSIS — Z7982 Long term (current) use of aspirin: Secondary | ICD-10-CM

## 2018-07-05 DIAGNOSIS — E119 Type 2 diabetes mellitus without complications: Secondary | ICD-10-CM

## 2018-07-05 DIAGNOSIS — F411 Generalized anxiety disorder: Secondary | ICD-10-CM | POA: Diagnosis present

## 2018-07-05 DIAGNOSIS — M797 Fibromyalgia: Secondary | ICD-10-CM | POA: Diagnosis present

## 2018-07-05 DIAGNOSIS — T383X5A Adverse effect of insulin and oral hypoglycemic [antidiabetic] drugs, initial encounter: Secondary | ICD-10-CM | POA: Diagnosis present

## 2018-07-05 LAB — CBC WITH DIFFERENTIAL/PLATELET
Abs Immature Granulocytes: 0.03 10*3/uL (ref 0.00–0.07)
Basophils Absolute: 0.1 10*3/uL (ref 0.0–0.1)
Basophils Relative: 1 %
Eosinophils Absolute: 0.2 10*3/uL (ref 0.0–0.5)
Eosinophils Relative: 2 %
HCT: 37.1 % (ref 36.0–46.0)
Hemoglobin: 11.6 g/dL — ABNORMAL LOW (ref 12.0–15.0)
Immature Granulocytes: 0 %
Lymphocytes Relative: 19 %
Lymphs Abs: 1.8 10*3/uL (ref 0.7–4.0)
MCH: 33.1 pg (ref 26.0–34.0)
MCHC: 31.3 g/dL (ref 30.0–36.0)
MCV: 106 fL — ABNORMAL HIGH (ref 80.0–100.0)
Monocytes Absolute: 0.6 10*3/uL (ref 0.1–1.0)
Monocytes Relative: 6 %
Neutro Abs: 7 10*3/uL (ref 1.7–7.7)
Neutrophils Relative %: 72 %
Platelets: 191 10*3/uL (ref 150–400)
RBC: 3.5 MIL/uL — ABNORMAL LOW (ref 3.87–5.11)
RDW: 14.2 % (ref 11.5–15.5)
WBC: 9.6 10*3/uL (ref 4.0–10.5)
nRBC: 0 % (ref 0.0–0.2)

## 2018-07-05 LAB — CBG MONITORING, ED
Glucose-Capillary: 114 mg/dL — ABNORMAL HIGH (ref 70–99)
Glucose-Capillary: 11 mg/dL — CL (ref 70–99)
Glucose-Capillary: 24 mg/dL — CL (ref 70–99)
Glucose-Capillary: 45 mg/dL — ABNORMAL LOW (ref 70–99)
Glucose-Capillary: 98 mg/dL (ref 70–99)
Glucose-Capillary: 92 mg/dL (ref 70–99)

## 2018-07-05 LAB — HEMOGLOBIN A1C
Hgb A1c MFr Bld: 6.2 % — ABNORMAL HIGH (ref 4.8–5.6)
Mean Plasma Glucose: 131.24 mg/dL

## 2018-07-05 LAB — COMPREHENSIVE METABOLIC PANEL
ALT: 11 U/L (ref 0–44)
AST: 11 U/L — ABNORMAL LOW (ref 15–41)
Albumin: 3.1 g/dL — ABNORMAL LOW (ref 3.5–5.0)
Alkaline Phosphatase: 118 U/L (ref 38–126)
Anion gap: 10 (ref 5–15)
BUN: 39 mg/dL — ABNORMAL HIGH (ref 6–20)
CO2: 24 mmol/L (ref 22–32)
Calcium: 7.7 mg/dL — ABNORMAL LOW (ref 8.9–10.3)
Chloride: 106 mmol/L (ref 98–111)
Creatinine, Ser: 4.91 mg/dL — ABNORMAL HIGH (ref 0.44–1.00)
GFR calc Af Amer: 11 mL/min — ABNORMAL LOW (ref >60)
GFR calc non Af Amer: 9 mL/min — ABNORMAL LOW (ref >60)
Glucose, Bld: 119 mg/dL — ABNORMAL HIGH (ref 70–99)
Potassium: 4.5 mmol/L (ref 3.5–5.1)
Sodium: 140 mmol/L (ref 135–145)
Total Bilirubin: 0.3 mg/dL (ref 0.3–1.2)
Total Protein: 6 g/dL — ABNORMAL LOW (ref 6.5–8.1)

## 2018-07-05 LAB — SARS CORONAVIRUS 2 BY RT PCR (HOSPITAL ORDER, PERFORMED IN ~~LOC~~ HOSPITAL LAB): SARS Coronavirus 2: NEGATIVE

## 2018-07-05 LAB — GLUCOSE, CAPILLARY
Glucose-Capillary: 77 mg/dL (ref 70–99)
Glucose-Capillary: 65 mg/dL — ABNORMAL LOW (ref 70–99)
Glucose-Capillary: 97 mg/dL (ref 70–99)

## 2018-07-05 LAB — TSH: TSH: 0.415 u[IU]/mL (ref 0.350–4.500)

## 2018-07-05 LAB — I-STAT BETA HCG BLOOD, ED (MC, WL, AP ONLY): I-stat hCG, quantitative: 9.1 m[IU]/mL — ABNORMAL HIGH (ref <5)

## 2018-07-05 MED ORDER — DEXTROSE 5 % IV SOLN
INTRAVENOUS | Status: DC
  Administered 2018-07-05 – 2018-07-06 (×2): via INTRAVENOUS

## 2018-07-05 MED ORDER — CYCLOBENZAPRINE HCL 5 MG PO TABS: 5.0000 mg | ORAL_TABLET | Freq: Three times a day (TID) | ORAL | Status: DC | PRN

## 2018-07-05 MED ORDER — SORBITOL 70 % SOLN: 30.0000 mL | Status: DC | PRN

## 2018-07-05 MED ORDER — DEXTROSE 50 % IV SOLN
INTRAVENOUS | Status: AC
  Filled 2018-07-05: qty 50

## 2018-07-05 MED ORDER — HYDROCODONE-ACETAMINOPHEN 10-325 MG PO TABS
1.0000 | ORAL_TABLET | Freq: Four times a day (QID) | ORAL | Status: DC | PRN
  Administered 2018-07-05 – 2018-07-07 (×6): 1 via ORAL
  Filled 2018-07-05 (×7): qty 1

## 2018-07-05 MED ORDER — ACETAMINOPHEN 650 MG RE SUPP: 650.0000 mg | Freq: Four times a day (QID) | RECTAL | Status: DC | PRN

## 2018-07-05 MED ORDER — CLONAZEPAM 1 MG PO TABS: 1.0000 mg | ORAL_TABLET | Freq: Every evening | ORAL | Status: DC | PRN

## 2018-07-05 MED ORDER — ONDANSETRON HCL 4 MG PO TABS: 4.0000 mg | ORAL_TABLET | Freq: Four times a day (QID) | ORAL | Status: DC | PRN

## 2018-07-05 MED ORDER — CARVEDILOL 25 MG PO TABS
25.0000 mg | ORAL_TABLET | Freq: Two times a day (BID) | ORAL | Status: DC
  Administered 2018-07-05 – 2018-07-07 (×4): 25 mg via ORAL
  Filled 2018-07-05: qty 2
  Filled 2018-07-05: qty 1
  Filled 2018-07-05 (×2): qty 2

## 2018-07-05 MED ORDER — ALBUTEROL SULFATE (2.5 MG/3ML) 0.083% IN NEBU: 2.5000 mg | INHALATION_SOLUTION | Freq: Four times a day (QID) | RESPIRATORY_TRACT | Status: DC | PRN

## 2018-07-05 MED ORDER — DEXTROSE 50 % IV SOLN
1.0000 | Freq: Once | INTRAVENOUS | Status: AC
  Administered 2018-07-05: 50 mL via INTRAVENOUS

## 2018-07-05 MED ORDER — NEPRO/CARBSTEADY PO LIQD: 237.0000 mL | Freq: Three times a day (TID) | ORAL | Status: DC | PRN

## 2018-07-05 MED ORDER — SODIUM CHLORIDE 0.9 % IV SOLN
INTRAVENOUS | Status: DC
  Administered 2018-07-05: 13:00:00 via INTRAVENOUS

## 2018-07-05 MED ORDER — HYDRALAZINE HCL 25 MG PO TABS
25.0000 mg | ORAL_TABLET | Freq: Three times a day (TID) | ORAL | Status: DC
  Administered 2018-07-05 – 2018-07-06 (×2): 25 mg via ORAL
  Filled 2018-07-05 (×2): qty 1

## 2018-07-05 MED ORDER — ZOLPIDEM TARTRATE 5 MG PO TABS: 5.0000 mg | ORAL_TABLET | Freq: Every evening | ORAL | Status: DC | PRN

## 2018-07-05 MED ORDER — ACETAMINOPHEN 325 MG PO TABS
650.0000 mg | ORAL_TABLET | Freq: Four times a day (QID) | ORAL | Status: DC | PRN
  Administered 2018-07-05 – 2018-07-06 (×2): 650 mg via ORAL
  Filled 2018-07-05 (×2): qty 2

## 2018-07-05 MED ORDER — CAMPHOR-MENTHOL 0.5-0.5 % EX LOTN
1.0000 "application " | TOPICAL_LOTION | Freq: Three times a day (TID) | CUTANEOUS | Status: DC | PRN
  Filled 2018-07-05: qty 222

## 2018-07-05 MED ORDER — HYDROXYZINE HCL 25 MG PO TABS: 25.0000 mg | ORAL_TABLET | Freq: Three times a day (TID) | ORAL | Status: DC | PRN

## 2018-07-05 MED ORDER — ISOSORBIDE MONONITRATE ER 30 MG PO TB24
30.0000 mg | ORAL_TABLET | Freq: Every day | ORAL | Status: DC
  Administered 2018-07-06: 30 mg via ORAL
  Filled 2018-07-05: qty 1

## 2018-07-05 MED ORDER — PANTOPRAZOLE SODIUM 40 MG PO TBEC
40.0000 mg | DELAYED_RELEASE_TABLET | Freq: Every day | ORAL | Status: DC
  Administered 2018-07-06 – 2018-07-07 (×2): 40 mg via ORAL
  Filled 2018-07-05 (×2): qty 1

## 2018-07-05 MED ORDER — NICOTINE 14 MG/24HR TD PT24
14.0000 mg | MEDICATED_PATCH | Freq: Every day | TRANSDERMAL | Status: DC
  Administered 2018-07-06 – 2018-07-07 (×2): 14 mg via TRANSDERMAL
  Filled 2018-07-05 (×2): qty 1

## 2018-07-05 MED ORDER — CALCIUM CARBONATE ANTACID 1250 MG/5ML PO SUSP: 500.0000 mg | Freq: Four times a day (QID) | ORAL | Status: DC | PRN

## 2018-07-05 MED ORDER — DOCUSATE SODIUM 283 MG RE ENEM
1.0000 | ENEMA | RECTAL | Status: DC | PRN
  Filled 2018-07-05: qty 1

## 2018-07-05 MED ORDER — ONDANSETRON HCL 4 MG/2ML IJ SOLN: 4.0000 mg | Freq: Four times a day (QID) | INTRAMUSCULAR | Status: DC | PRN

## 2018-07-05 MED ORDER — ROSUVASTATIN CALCIUM 20 MG PO TABS
40.0000 mg | ORAL_TABLET | Freq: Every day | ORAL | Status: DC
  Administered 2018-07-06 – 2018-07-07 (×2): 40 mg via ORAL
  Filled 2018-07-05 (×2): qty 2

## 2018-07-05 MED ORDER — PREGABALIN 75 MG PO CAPS
150.0000 mg | ORAL_CAPSULE | Freq: Every evening | ORAL | Status: DC
  Administered 2018-07-05 – 2018-07-06 (×2): 150 mg via ORAL
  Filled 2018-07-05: qty 1
  Filled 2018-07-05: qty 2

## 2018-07-05 MED ORDER — DEXTROSE 50 % IV SOLN
INTRAVENOUS | Status: AC
  Administered 2018-07-05: 25 mL
  Filled 2018-07-05: qty 50

## 2018-07-05 MED ORDER — SODIUM CHLORIDE 0.9% FLUSH
3.0000 mL | Freq: Two times a day (BID) | INTRAVENOUS | Status: DC
  Administered 2018-07-06 (×2): 3 mL via INTRAVENOUS

## 2018-07-05 MED ORDER — ASPIRIN EC 81 MG PO TBEC
81.0000 mg | DELAYED_RELEASE_TABLET | Freq: Every day | ORAL | Status: DC
  Administered 2018-07-06 – 2018-07-07 (×2): 81 mg via ORAL
  Filled 2018-07-05 (×2): qty 1

## 2018-07-05 NOTE — ED Notes (Signed)
Pt transported to xray 

## 2018-07-05 NOTE — Discharge Instructions (Addendum)
Please be sure to take your insulin as scheduled, but also ensure that you have eaten food appropriately.  Return here for concerning changes in your condition.

## 2018-07-05 NOTE — H&P (Signed)
History and Physical    Audrey Olson VXB:[id number] DOB: 1961/01/26 DOA: 07/05/2018  PCP: Audrey Axe, MD Consultants:  Rosario;C[facility]shaw - cardiology; Brayton El - nephrology; [place] - pulmonology Patient coming from:  Home - lives with aunt and cousin; [place]: Audrey Olson, [phone]  Chief Complaint: syncope  HPI: Audrey Olson is a 58 y.o. female with medical history significant of DM; morbid obesity (BMI 42); COPD with ongoing tobacco dependence; OSA; RLS; panic d/o; NICM with EF 25-30% in 3/15; HTN; HLD; ESRD on TTS HD (missed yesterday because she overslept); and chronic pain presenting with syncope.  She reports she had a doctor's appointment this AM.  She was there waiting and the last thing she remembers is waiting and then she remembers EMS getting her off the floor.  She obviously fell and hit her head because it busted her head open.  They said it was because her sugar went low.  She missed HD yesterday because she didn't sleep well with the weather messing up her neuropathy; she knew she wouldn't be able to sit still during HD.  She doesn't ever miss her insulin but she forgot to eat.  Her sugar usually runs very high so today was really unusual.   ED Course:  Syncope - ESRD and took normal dose of insulin and did not eat.  Glucose 11 on arrival, given D50 x 2 amps with improvement and then dropped back to the 20s.  Evaluation unremarkable; similar presentation recently.  On D5 infusion.  Likely medication misadventure.  Will call nephrology since she missed HD yesterday.  Review of Systems: As per HPI; otherwise review of systems reviewed and negative.   Ambulatory Status:  Ambulates without assistance  Past Medical History:  Diagnosis Date   Alpha-1-antitrypsin deficiency ([place])    pt is not aware of this diagnosis   Anemia    Anxiety    Arthritis    Lower back and both hips with severe chronic pain and limited mobility   Bell's palsy    Chronic combined systolic and diastolic  CHF (congestive heart failure) ([place])    a. 04/2013 Echo: EF 25-30%.   Chronic pain    fibromyalgia, LBP   COPD (chronic obstructive pulmonary disease) (HCC)    uses Albuterol daily as needed   Degeneration of lumbar or lumbosacral intervertebral disc    Diabetic neuropathy (HCC)    Generalized anxiety disorder    Hepatomegaly    With splenomegaly, hemangioma of spleen   History of colon polyps    History of gastric ulcer    History of MRSA infection 2005   Reported in 2005. Also had I&D of groin abscess 05/2013 that grew MRSA.   Hyperlipidemia    takes Crestor daily as well as Lovaza   Hypertension    takes Diovan daily   Internal hemorrhoids    LVH (left ventricular hypertrophy)    Obstructive sleep apnea    a. mild OSA by 08/16/11 sleep study [facility]);  b. does not tolerate CPAP.   Osteoporosis    Peripheral neuropathy    takes Lyrica daily   Restless legs syndrome (RLS)    Severe obesity (BMI 35.0-39.9) with comorbidity ([place])    Tobacco abuse    Type II diabetes mellitus ([place])    Insulin-dependent since 2008 - takes Novolog and Lantus daily as well as Metformin   Vitamin D deficiency    takes Vit D 50000 units weekly    Past Surgical History:  Procedure Laterality Date  APPENDECTOMY     BREAST BIOPSY Left    CARDIAC CATHETERIZATION  around 2007/2014   x 2   CHOLECYSTECTOMY     CHOLECYSTECTOMY     COLONOSCOPY     ESOPHAGOGASTRODUODENOSCOPY     ganglion cyst removed Left    MULTIPLE EXTRACTIONS WITH ALVEOLOPLASTY N/A 05/21/2013   Procedure: Extraction of tooth #'s 12,19, and 30 with alveoloplasty and gross debridement of remaining teeth;  Surgeon: [name], DDS;  Location: [facility];  Service: Oral Surgery;  Laterality: N/A;   MULTIPLE TOOTH EXTRACTIONS  05/21/2013   TOOTH #12  #19  WITH ALVEOLOPLASTY     NASAL SINUS SURGERY      Social History   Socioeconomic History   Marital status: Divorced    Spouse name:  Not on file   Number of children: 2   Years of education: Not on file   Highest education level: Not on file  Occupational History   Occupation: disabled  Social Designer, fashion/clothing strain: Not on file   Food insecurity:    Worry: Not on file    Inability: Not on file   Transportation needs:    Medical: Not on file    Non-medical: Not on file  Tobacco Use   Smoking status: Current Every Day Smoker    Packs/day: 2.00    Years: 41.00    Pack years: 82.00    Types: Cigarettes    Start date: 02/13/1974   Smokeless tobacco: Never Used   Tobacco comment: 06/2018: currently smoking 1/3 ppd.  Substance and Sexual Activity   Alcohol use: No    Comment: rare   Drug use: No   Sexual activity: Not Currently    Birth control/protection: Post-menopausal  Lifestyle   Physical activity:    Days per week: Not on file    Minutes per session: Not on file   Stress: Not on file  Relationships   Social connections:    Talks on phone: Not on file    Gets together: Not on file    Attends religious service: Not on file    Active member of club or organization: Not on file    Attends meetings of clubs or organizations: Not on file    Relationship status: Not on file   Intimate partner violence:    Fear of current or ex partner: Not on file    Emotionally abused: Not on file    Physically abused: Not on file    Forced sexual activity: Not on file  Other Topics Concern   Not on file  Social History Narrative   Lives in [place] with sister.  Unemployed    Allergies  Allergen Reactions   Sulfa Antibiotics Anaphylaxis    Anaphylaxis with Sulfa only when combined with 800 mg Ibuprofen   Adhesive [Tape]     Breaks out   Ropinirole    Ibuprofen Rash    Family History  Problem Relation Age of Onset   Hyperlipidemia Mother    Hypertension Mother    Lung disease Mother 29   Hyperlipidemia Father    Diabetes Mellitus II Father    Hypertension  Father    Cancer Father     Prior to Admission medications   Medication Sig Start Date End Date Taking? Authorizing Provider  albuterol (PROVENTIL HFA;VENTOLIN HFA) 108 (90 BASE) MCG/ACT inhaler Inhale 2 puffs into the lungs every 6 (six) hours as needed for wheezing or shortness of breath.  [provider]  aspirin EC 81 MG tablet Take 81 mg by mouth daily.    [provider]  carvedilol (COREG) 25 MG tablet Take 25 mg by mouth 2 (two) times daily with a meal.    [provider]  clonazePAM (KLONOPIN) 1 MG tablet Take 1 mg by mouth at bedtime as needed for anxiety.    [provider]  cyclobenzaprine (FLEXERIL) 10 MG tablet Take 5 mg by mouth 3 (three) times daily as needed for muscle spasms.     [provider]  hydrALAZINE (APRESOLINE) 25 MG tablet Take 1 tablet (25 mg total) by mouth 3 (three) times daily. 08/26/15   Lelon Perla, MD  HYDROcodone-acetaminophen (NORCO) 10-325 MG per tablet Take 1 tablet by mouth every 6 (six) hours as needed for moderate pain.     [provider]  insulin aspart (NOVOLOG FLEXPEN) 100 UNIT/ML FlexPen Inject 6-36 Units into the skin 3 (three) times daily with meals. Sliding scale    [provider]  insulin glargine (LANTUS) 100 UNIT/ML injection Inject 10 Units into the skin 2 (two) times daily.     [provider]  isosorbide mononitrate (IMDUR) 30 MG 24 hr tablet Take 1 tablet (30 mg total) by mouth daily. 08/26/15   Lelon Perla, MD  Magnesium Oxide 250 MG TABS Take 1 tablet by mouth daily.    [provider]  nitroGLYCERIN (NITROSTAT) 0.4 MG SL tablet Place 0.4 mg under the tongue every 5 (five) minutes as needed for chest pain.    [provider]  omega-3 acid ethyl esters (LOVAZA) 1 G capsule Take 4 g by mouth daily.     [provider]  omeprazole (PRILOSEC) 40 MG capsule Take 40 mg by mouth daily.    [provider]  pregabalin (LYRICA)  150 MG capsule Take 150 mg by mouth every evening.     [provider]  rosuvastatin (CRESTOR) 40 MG tablet Take 40 mg by mouth daily.    [provider]  Vitamin D, Ergocalciferol, (DRISDOL) 50000 UNITS CAPS capsule Take 50,000 Units by mouth every 7 (seven) days. Tuesdays    [provider]    Physical Exam: Vitals:   07/05/18 1315 07/05/18 1345 07/05/18 1526 07/05/18 1600  BP: (!) 189/91 (!) 189/89 (!) 161/101 (!) 170/79  Pulse: 85 84 92 94  Resp: (!) 27 17 (!) 22 17  SpO2: 93% 94% 94% 94%  Weight:      Height:          General:  Appears calm and comfortable and is NAD  Eyes:  PERRL, EOMI, normal lids, iris  ENT:  grossly normal hearing, lips & tongue, mmm  Neck:  no LAD, masses or thyromegaly  Cardiovascular:  RRR, no m/r/g. No LE edema.   Respiratory:   CTA bilaterally with no wheezes/rales/rhonchi.  Normal respiratory effort.  Abdomen:  soft, NT, ND, NABS  Skin:  Large superior scalp laceration s/p repair with 8 staples.  Also with superficial R forehead scalp hematoma and lacerations that are hemostatic.       Musculoskeletal:  grossly normal tone BUE/BLE, good ROM, no bony abnormality  Psychiatric:  grossly normal mood and affect, speech fluent and appropriate, AOx3.  Mild cognitive slowing - baseline vs. Concussive is less clear.  Neurologic:  CN 2-12 grossly intact, moves all extremities in coordinated fashion, sensation intact    Radiological Exams on Admission: Dg Chest 2 View  Result Date: 07/05/2018 CLINICAL  DATA:  Syncope, status post fall EXAM: CHEST - 2 VIEW COMPARISON:  05/03/2018 FINDINGS: There is no focal parenchymal opacity. There is no pleural effusion or pneumothorax. The heart and mediastinal contours are unremarkable. The osseous structures are unremarkable. IMPRESSION: No active cardiopulmonary disease. Electronically Signed   By: Kathreen Devoid   On: 07/05/2018 12:44   Ct Head Wo Contrast  Result Date:  07/05/2018 CLINICAL DATA:  Syncopal episode.  Fall.  Trauma to head. EXAM: CT HEAD WITHOUT CONTRAST TECHNIQUE: Contiguous axial images were obtained from the base of the skull through the vertex without intravenous contrast. COMPARISON:  CT head without contrast 02/21/2016 FINDINGS: Brain: No acute infarct, hemorrhage, or mass lesion is present. 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 No significant white matter lesions are present. Basal ganglia are intact. No significant extraaxial fluid collection is present. The brainstem and cerebellum are within normal limits. Vascular: Atherosclerotic calcifications are present within the cavernous internal carotid arteries. There is no hyperdense vessel. Skull: A large right supraorbital scalp hematoma and laceration is present. No radiopaque foreign body is present. There is no underlying fracture. Sinuses/Orbits: The paranasal sinuses and mastoid air cells are clear. The globes and orbits are within normal limits. IMPRESSION: 1. Large right supraorbital scalp hematoma and laceration without underlying fracture. 2. No acute intracranial abnormality. 3. 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 Electronically Signed   By: San Morelle M.D.   On: 07/05/2018 13:45    EKG: Independently reviewed.  NSR with rate 78; prolonged QT 529; nonspecific ST changes with no evidence of acute ischemia; NSCSLT   Labs on Admission: I have personally reviewed the available labs and imaging studies at the time of the admission.  Pertinent labs:   Glucose 119, 114, 11, 45, 24 BUN 39/Creatinine 4.91/GFR 9 Albumin 3.1 WBC 9.6 Hgb 11.6 HCG 9.1  Assessment/Plan Principal Problem:   Syncope Active Problems:   Chronic combined systolic and diastolic CHF (congestive heart failure) (HCC)   Type II diabetes mellitus (HCC)   Anxiety   ESRD (end stage renal disease) on dialysis (HCC)   Tobacco abuse   Obstructive sleep apnea   Severe  obesity (BMI 35.0-39.9) with comorbidity ([place])   Hypertension   Hypercholesterolemia   Hypoglycemia due to insulin   Syncope -Etiology very likely related to hypoglycemia -She had a significant fall with head trauma -Will monitor overnight on telemetry -Orthostatic vital signs now and in AM -Neuro checks  -Continue ASA  DM with hypoglycemia -Known DM for which she takes Lantus -She reports compliance with insulin but that she "forgot" to eat this AM -She also did not have HD yesterday as scheduled -This is likely the source of her syncope -Will hold Lantus -She has been started on a D5 drip and glucose is currently 98 -Will continue D5 at 100 cc/hr for now with plan to check glucose q2h x 5 and d/c D5 when glucose is >150 x 2 -Will check glucose q1h x 2 following d/c of D5 infusion -Will continue to check glucose q4h following q2h checks -Will not cover with SSI until no longer needing D5 and stable for at least 4 hours -Patient may eat -Will request DM coordinator consult -Last A1c was in 8/19 and so will recheck; it was 8.4 at that time  ESRD on HD -Patient on chronic TTS HD; she missed yesterday's session -Nephrology prn order set utilized -She does not appear to be volume overloaded or otherwise in need of  acute HD -She is receiving D5 infusion at this time and this may more urgently necessitate HD -Nephrology has been notified by the ER about need for consultation  HTN -Continue Coreg, Hydralazine  HLD -Continue Crestor  Chronic combined CHF -1/18 Echo with EF 30-35%; prior 2015 echo with grade 4 diastolic dysfunction -Appears to be compensated at this time -Volume status is managed with HD  OSA on CPAP -Continue CPAP  Morbid obesity -Weight loss should be encouraged on an ongoing basis  Tobacco dependence -Encourage cessation.   -This was discussed with the patient and should be reviewed on an ongoing basis.   -Patch ordered at patient  request.    Note: This patient will be tested and is currently pending for the novel coronavirus COVID-19.  DVT prophylaxis:  SCDs Code Status:  Full - confirmed with patient Family Communication: None present; she did not request that I contact her daughter.  Disposition Plan:  Home once clinically improved Consults called: Nephrology  Admission status: It is my clinical opinion that referral for OBSERVATION is reasonable and necessary in this patient based on the above information provided. The aforementioned taken together are felt to place the patient at high risk for further clinical deterioration. However it is anticipated that the patient may be medically stable for discharge from the hospital within 24 to 48 hours.    Karmen Bongo MD [facility]   How to contact the [facility] Attending or Consulting provider [place] or covering provider during after hours [place], for this patient?  1. Check the care team in [facility] and look for a) attending/consulting [facility] provider listed and b) the [facility], The team listed 2. Log into [email or web address] and use [place]'s universal password to access. If you do not have the password, please contact the hospital operator. 3. Locate the [facility] provider you are looking for under [facility] and page to a number that you can be directly reached. 4. If you still have difficulty reaching the provider, please page the [facility] (Director on Call) for the Hospitalists listed on amion for assistance.   07/05/2018, 4:32 PM

## 2018-07-05 NOTE — ED Notes (Signed)
Cleansed pt's head wounds. Found lac to left upper head. MD placed staples. Pt eating a sandwich and ginerale.

## 2018-07-05 NOTE — Progress Notes (Signed)
Patient is refusing the use of CPAP at this time. RT educated patient and informed patient to call if she changes her mind.

## 2018-07-05 NOTE — ED Triage Notes (Signed)
Pt was at MD office today waiting to be seen when she had a syncopal episode. Pt hit her head on the tile ground. CBG initially 40 so pt was given 25g glucose and repeat CBG 54. Pt given another 25g and CBG up to 78. Hematoma left head with bleeding, controlled at this time No blood thinners Dialysis pt on t th Sat, missed Thursday. Pt took lantus today but has not eaten.  18RAC VSS

## 2018-07-05 NOTE — ED Notes (Signed)
Pt alert, eating sandwich, cheese crackers

## 2018-07-05 NOTE — ED Notes (Signed)
Pt updating her dtr that she will be admitted and gave her number to [id number]

## 2018-07-05 NOTE — Progress Notes (Signed)
Hypoglycemic Event  CBG: 65    Treatment: Patient doesn't want to drink anything at the moment. Will administer  D50 25 ML  Symptoms: Sweaty  Follow-up CBG: Time: 97  CBG Result:  Possible Reasons for Event: Inadequate meal intake  Comments/MD notified: X. Kennon Holter, NP    Jolaine Click

## 2018-07-05 NOTE — ED Provider Notes (Signed)
[facility] EMERGENCY DEPARTMENT Provider Note   CSN: [id number] Arrival date & time: 07/05/18  1205    History   Chief Complaint Chief Complaint  Patient presents with  . Loss of Consciousness    HPI Audrey Olson is a 58 y.o. female.     HPI Presents after an episode of syncope. Patient has multiple medical issues including dialysis, diabetes. Today she had an episode of syncope, prompting evaluation. She notes that she missed her last dialysis session yesterday, as she overslept. Today, she went to physicians appointment.  She states that she did take her medications including insulin, but did not eat this morning. She had an episode of syncope, with minimal prodrome. During the episode she fell, striking her head. She currently has pain throughout her forehead, but no chest pain, no neck pain, no abdominal pain, no weakness in any extremity. Headache is severe, 9/10, throughout the anterior forehead. She sustained wounds as well, has had bleeding since the event.    Past Medical History:  Diagnosis Date  . Acid reflux   . Alpha-1-antitrypsin deficiency ([place])    pt is not aware of this diagnosis  . Anemia   . Anxiety   . Arthritis    Lower back and both hips with severe chronic pain and limited mobility  . Back pain    pt states over 100 bones spurs on her left hip;scoliosis  . Bell's palsy   . Childhood asthma   . Chronic combined systolic and diastolic CHF (congestive heart failure) ([place])    a. 04/2013 Echo: EF 25-30%.  . Chronic pain   . COPD (chronic obstructive pulmonary disease) (H[facility])    uses Albuterol daily as needed  . Degeneration of lumbar or lumbosacral intervertebral disc   . Diabetic neuropathy ([place])   . Disorders of magnesium metabolism   . Fatty pancreas   . Fibromyalgia   . Generalized anxiety disorder   . Headache(784.0)    d/t Bells palsy and last headache 93months ago  . Hemangioma of spleen   . Hepatomegaly    With  splenomegaly  . History of colon polyps   . History of gastric ulcer   . History of MRSA infection 2005   Reported in 2005. Also had I&D of groin abscess 05/2013 that grew MRSA.  Marland Kitchen History of rectal bleeding   . History of shingles 2005  . Hyperlipidemia    takes Crestor daily as well as Lovaza  . Hypertension    takes Diovan daily  . Hyponatremia   . Internal hemorrhoids   . Joint Swelling and Pain    knees,shoulders,elbows,and hands  . Lumbar spondylosis   . LVH (left ventricular hypertrophy)   . Mitral regurgitation    a. Prev noted to be mod-sev by TEE 02/2013;  b. 04/2013 Echo: EF 25-30%, triv MR.  . Multiple allergies    uses Flonase daily as needed  . Non-ischemic cardiomyopathy ([place])    a. 04/2013 Echo: EF 25-30%, glob HK, basal and mid antsept/infersept/ant AK, Grade 4 DD, Triv AI/MR, mod dil LA.  Marland Kitchen Obstructive sleep apnea    a. mild OSA by 08/16/11 sleep study [facility]);  b. does not tolerate CPAP.  Marland Kitchen Osteoporosis   . Panic attacks    takes Klonopin nightly  . Peripheral neuropathy    takes Lyrica daily  . Restless legs syndrome (RLS)   . Severe obesity (BMI 35.0-39.9) with comorbidity ([place])   . Tobacco abuse   .  Type II diabetes mellitus ([place])    Insulin-dependent since 2008 - takes Novolog and Lantus daily as well as Metformin  . Unspecified urinary incontinence   . Vitamin D deficiency    takes Vit D 50000 units weekly    Patient Active Problem List   Diagnosis Date Noted  . Abscess of groin, left -s/p I&D 05/22/13 05/23/2013  . Mitral valve disease 05/21/2013  . Chronic combined systolic and diastolic CHF (congestive heart failure) ([place])   . Non-ischemic cardiomyopathy ([place])   . Mitral regurgitation   . Type II diabetes mellitus ([place])   . Diabetic neuropathy ([place])   . Chronic pain   . Panic attacks   . Anxiety   . Chronic kidney disease   . Diabetic nephropathy ([place])   . Tobacco abuse   . COPD (chronic obstructive pulmonary disease) ([place])    . Obstructive sleep apnea   . Severe obesity (BMI 35.0-39.9) with comorbidity ([place])   . Arthritis   . Hypertension   . Hypercholesterolemia   . Osteoporosis   . Vitamin D deficiency     Past Surgical History:  Procedure Laterality Date  . APPENDECTOMY    . BREAST BIOPSY Left   . CARDIAC CATHETERIZATION  around 2007/2014   x 2  . CHOLECYSTECTOMY    . CHOLECYSTECTOMY    . COLONOSCOPY    . ESOPHAGOGASTRODUODENOSCOPY    . ganglion cyst removed Left   . MULTIPLE EXTRACTIONS WITH ALVEOLOPLASTY N/A 05/21/2013   Procedure: Extraction of tooth #'s 12,19, and 30 with alveoloplasty and gross debridement of remaining teeth;  Surgeon: [name], DDS;  Location: [facility];  Service: Oral Surgery;  Laterality: N/A;  . MULTIPLE TOOTH EXTRACTIONS  05/21/2013   TOOTH #12  #19  WITH ALVEOLOPLASTY    . NASAL SINUS SURGERY       OB History   No obstetric history on file.      Home Medications    Prior to Admission medications   Medication Sig Start Date End Date Taking? Authorizing Provider  albuterol (PROVENTIL HFA;VENTOLIN HFA) 108 (90 BASE) MCG/ACT inhaler Inhale 2 puffs into the lungs every 6 (six) hours as needed for wheezing or shortness of breath.    [provider]  aspirin EC 81 MG tablet Take 81 mg by mouth daily.    [provider]  carvedilol (COREG) 25 MG tablet Take 25 mg by mouth 2 (two) times daily with a meal.    [provider]  clonazePAM (KLONOPIN) 1 MG tablet Take 1 mg by mouth at bedtime as needed for anxiety.    [provider]  cyclobenzaprine (FLEXERIL) 10 MG tablet Take 5 mg by mouth 3 (three) times daily as needed for muscle spasms.     [provider]  hydrALAZINE (APRESOLINE) 25 MG tablet Take 1 tablet (25 mg total) by mouth 3 (three) times daily. 08/26/15   Lelon Perla, MD  HYDROcodone-acetaminophen (NORCO) 10-325 MG per tablet Take 1 tablet by mouth every 6 (six) hours as needed for moderate pain.     [provider]  insulin aspart (NOVOLOG FLEXPEN) 100 UNIT/ML FlexPen Inject 6-36 Units into the skin 3 (three) times daily with meals. Sliding scale    [provider]  insulin glargine (LANTUS) 100 UNIT/ML injection Inject 10 Units into the skin 2 (two) times daily.     [provider]  isosorbide mononitrate (IMDUR) 30 MG 24 hr tablet Take 1 tablet (30 mg total) by mouth  daily. 08/26/15   Lelon Perla, MD  Magnesium Oxide 250 MG TABS Take 1 tablet by mouth daily.    [provider]  nitroGLYCERIN (NITROSTAT) 0.4 MG SL tablet Place 0.4 mg under the tongue every 5 (five) minutes as needed for chest pain.    [provider]  omega-3 acid ethyl esters (LOVAZA) 1 G capsule Take 4 g by mouth daily.     [provider]  omeprazole (PRILOSEC) 40 MG capsule Take 40 mg by mouth daily.    [provider]  pregabalin (LYRICA) 150 MG capsule Take 150 mg by mouth every evening.     [provider]  rosuvastatin (CRESTOR) 40 MG tablet Take 40 mg by mouth daily.    [provider]  Vitamin D, Ergocalciferol, (DRISDOL) 50000 UNITS CAPS capsule Take 50,000 Units by mouth every 7 (seven) days. Tuesdays    [provider]    Family History Family History  Problem Relation Age of Onset  . Hyperlipidemia Mother   . Hypertension Mother   . Lung disease Mother 31  . Hyperlipidemia Father   . Diabetes Mellitus II Father   . Hypertension Father   . Cancer Father     Social History Social History   Tobacco Use  . Smoking status: Current Every Day Smoker    Packs/day: 0.25    Years: 38.00    Pack years: 9.50    Types: Cigarettes    Start date: 02/13/1974  . Smokeless tobacco: Never Used  . Tobacco comment: 05/2013: currently smoking 1/3 ppd.  Substance Use Topics  . Alcohol use: No    Comment: rare  . Drug use: No     Allergies   Sulfa antibiotics; Adhesive [tape]; Ropinirole; and Ibuprofen   Review of Systems  Review of Systems  Constitutional:       Per HPI, otherwise negative  HENT:       Per HPI, otherwise negative  Respiratory:       Per HPI, otherwise negative  Cardiovascular:       Per HPI, otherwise negative  Gastrointestinal: Negative for vomiting.  Endocrine:       Negative aside from HPI  Genitourinary:       Neg aside from HPI   Musculoskeletal:       Per HPI, otherwise negative  Skin: Positive for wound.  Allergic/Immunologic: Positive for immunocompromised state.  Neurological: Negative for syncope.     Physical Exam Updated Vital Signs BP (!) 189/89   Pulse 84   Resp 17   Ht 5\' 2"  (1.575 m)   Wt 104.3 kg   SpO2 94%   BMI 42.07 kg/m   Physical Exam Vitals signs and nursing note reviewed.  Constitutional:      General: She is not in acute distress.    Appearance: She is well-developed.     Comments: Obese adult female awake and alert.  HENT:     Head: Laceration present.   Eyes:     Conjunctiva/sclera: Conjunctivae normal.  Neck:     Musculoskeletal: Full passive range of motion without pain. No injury, spinous process tenderness or muscular tenderness.  Cardiovascular:     Rate and Rhythm: Normal rate and regular rhythm.  Pulmonary:     Effort: Pulmonary effort is normal. No respiratory distress.     Breath sounds: Normal breath sounds. No stridor.  Abdominal:     General: There is no distension.  Skin:    General: Skin is warm  and dry.  Neurological:     Mental Status: She is alert and oriented to person, place, and time.     Cranial Nerves: No cranial nerve deficit.      ED Treatments / Results  Labs (all labs ordered are listed, but only abnormal results are displayed) Labs Reviewed  COMPREHENSIVE METABOLIC PANEL - Abnormal; Notable for the following components:      Result Value   Glucose, Bld 119 (*)    BUN 39 (*)    Creatinine, Ser 4.91 (*)    Calcium 7.7 (*)    Total Protein 6.0 (*)    Albumin 3.1 (*)    AST 11 (*)    GFR calc  non Af Amer 9 (*)    GFR calc Af Amer 11 (*)    All other components within normal limits  CBC WITH DIFFERENTIAL/PLATELET - Abnormal; Notable for the following components:   RBC 3.50 (*)    Hemoglobin 11.6 (*)    MCV 106.0 (*)    All other components within normal limits  CBG MONITORING, ED - Abnormal; Notable for the following components:   Glucose-Capillary 114 (*)    All other components within normal limits  I-STAT BETA HCG BLOOD, ED ([facility], [facility], [facility] ONLY) - Abnormal; Notable for the following components:   I-stat hCG, quantitative 9.1 (*)    All other components within normal limits    EKG EKG Interpretation  Date/Time:  Friday Jul 05 2018 12:12:33 EDT Ventricular Rate:  78 PR Interval:    QRS Duration: 110 QT Interval:  464 QTC Calculation: 529 R Axis:   4 Text Interpretation:  Sinus rhythm Anteroseptal infarct, old Prolonged QT interval No significant change since last tracing Abnormal ekg Confirmed by Carmin Muskrat [phone]) on 07/05/2018 3:22:55 PM   Radiology Dg Chest 2 View  Result Date: 07/05/2018 CLINICAL DATA:  Syncope, status post fall EXAM: CHEST - 2 VIEW COMPARISON:  05/03/2018 FINDINGS: There is no focal parenchymal opacity. There is no pleural effusion or pneumothorax. The heart and mediastinal contours are unremarkable. The osseous structures are unremarkable. IMPRESSION: No active cardiopulmonary disease. Electronically Signed   By: Kathreen Devoid   On: 07/05/2018 12:44   Ct Head Wo Contrast  Result Date: 07/05/2018 CLINICAL DATA:  Syncopal episode.  Fall.  Trauma to head. EXAM: CT HEAD WITHOUT CONTRAST TECHNIQUE: Contiguous axial images were obtained from the base of the skull through the vertex without intravenous contrast. COMPARISON:  CT head without contrast 02/21/2016 FINDINGS: Brain: No acute infarct, hemorrhage, or mass lesion is present. 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 No significant white matter lesions are present. Basal ganglia are intact. No  significant extraaxial fluid collection is present. The brainstem and cerebellum are within normal limits. Vascular: Atherosclerotic calcifications are present within the cavernous internal carotid arteries. There is no hyperdense vessel. Skull: A large right supraorbital scalp hematoma and laceration is present. No radiopaque foreign body is present. There is no underlying fracture. Sinuses/Orbits: The paranasal sinuses and mastoid air cells are clear. The globes and orbits are within normal limits. IMPRESSION: 1. Large right supraorbital scalp hematoma and laceration without underlying fracture. 2. No acute intracranial abnormality. 3. 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 Electronically Signed   By: San Morelle M.D.   On: 07/05/2018 13:45    Procedures Procedures (including critical care time)   LACERATION REPAIR Performed by: Carmin Muskrat Authorized by: Carmin Muskrat Consent: Verbal consent obtained. Risks and benefits:  risks, benefits and alternatives were discussed Consent given by: patient Patient identity confirmed: provided demographic data Prepped and Draped in normal sterile fashion Wound explored  Laceration Location: scalp   Laceration Length: 14cm  No Foreign Bodies seen or palpated    Irrigation method: syringe Amount of cleaning: standard  Skin closure: staples  Number of staples: 8  Technique: close  Patient tolerance: Patient tolerated the procedure well with no immediate complications.  CRITICAL CARE Performed by: Carmin Muskrat Total critical care time: 35 minutes Critical care time was exclusive of separately billable procedures and treating other patients. Critical care was necessary to treat or prevent imminent or life-threatening deterioration. Critical care was time spent personally by me on the following activities: development of treatment plan with patient and/or surrogate as well as nursing,  discussions with consultants, evaluation of patient's response to treatment, examination of patient, obtaining history from patient or surrogate, ordering and performing treatments and interventions, ordering and review of laboratory studies, ordering and review of radiographic studies, pulse oximetry and re-evaluation of patient's condition.  Medications Ordered in ED Medications  0.9 %  sodium chloride infusion ( Intravenous New Bag/Given 07/05/18 1236)  dextrose 50 % solution (has no administration in time range)  dextrose 50 % solution 50 mL (50 mLs Intravenous Given 07/05/18 1413)     Initial Impression / Assessment and Plan / ED Course  I have reviewed the triage vital signs and the nursing notes.  Pertinent labs & imaging results that were available during my care of the patient were reviewed by me and considered in my medical decision making (see chart for details).        2:16 PM Initial studies reassuring, CT scans reassuring, no evidence for intracranial injury. However, mom ago the patient was listless, found to have a glucose of 11. She subsequently improved substantially with dextrose, and upon awakening will require additional food.  This patient with multiple medical issues including end-stage renal disease, insulin-dependent diabetes presents with concern of an episode of syncope, and head wound. Patient's acknowledgment of taking her insulin, but not eating today suggests hyperglycemia is likely etiology for her syncope event. EKG reassuring, and the patient has no history of sustained arrhythmia. Here the patient was awake and alert, but had an episode of listlessness, likely associated with hypoglycemia, as her glucose level 11, after she returned from CT scan. Patient improved substantially, was awake, alert, moving all extremities again, was subsequently tolerant of oral intake.  3:23 PM Patient had a recurrence of hypoglycemia, glucose level 24. In addition to  another amp of D50, she will require initiation of dextrose drip. She has tolerated stable repair well, has no new complaints, was transiently much more awake, eating.  However, with persistent hypoglycemia, likely due to the patient's taking insulin without food intake earlier today, and following an episode of syncope, collapse, she will require admission for monitoring, management.   Final Clinical Impressions(s) / ED Diagnoses   Final diagnoses:  Syncope and collapse  Hypoglycemia  Injury of head, initial encounter     Carmin Muskrat, MD 07/05/18 1525

## 2018-07-05 NOTE — ED Notes (Signed)
The pt's dtr called for an update, this RN informed family on pt's status and will continue to keep them up to date.

## 2018-07-05 NOTE — ED Notes (Signed)
This RN attempted to call pt's dtr and son to inform them that pt's bed assignment has changed. Unable to reach them

## 2018-07-05 NOTE — ED Notes (Signed)
Pt diaphoretic, altered, RN checked CBG- 11. Notified MD. Gave IV D50. MD at bedside

## 2018-07-05 NOTE — Progress Notes (Addendum)
Saralynn Langhorst is a 58 y.o. female patient admitted from ED awake, alert - oriented  X 4 - no acute distress noted.  VSS - Blood pressure (!) 168/108, pulse (!) 103, temperature 97.8 F (36.6 C), temperature source Oral, resp. rate 20, height 5\' 2"  (1.575 m), weight 104.3 kg, SpO2 100 %.    IV in place, occlusive dsg intact without redness.  Orientation to room, and floor completed with information packet given to patient/family.  Patient declined safety video at this time.  Admission INP armband ID verified with patient/family, and in place.   SR up x 2, fall assessment complete, with patient and family able to verbalize understanding of risk associated with falls, and verbalized understanding to call nsg before up out of bed.  Call light within reach, patient able to voice, and demonstrate understanding.  Hematoma to right forehead, laceration top of head with 8 staples. Abrasion to left knee.  No evidence of skin break down noted on exam.     Will cont to eval and treat per MD orders.  Tama High, RN 07/05/2018 6:25 PM

## 2018-07-05 NOTE — ED Notes (Signed)
Nurse navigator spoke with patient with son to update son plan of care and arrange transportation if discharged.

## 2018-07-06 DIAGNOSIS — Z992 Dependence on renal dialysis: Secondary | ICD-10-CM | POA: Diagnosis not present

## 2018-07-06 DIAGNOSIS — M797 Fibromyalgia: Secondary | ICD-10-CM | POA: Diagnosis present

## 2018-07-06 DIAGNOSIS — S0101XA Laceration without foreign body of scalp, initial encounter: Secondary | ICD-10-CM | POA: Diagnosis present

## 2018-07-06 DIAGNOSIS — E785 Hyperlipidemia, unspecified: Secondary | ICD-10-CM | POA: Diagnosis present

## 2018-07-06 DIAGNOSIS — N186 End stage renal disease: Secondary | ICD-10-CM | POA: Diagnosis present

## 2018-07-06 DIAGNOSIS — M810 Age-related osteoporosis without current pathological fracture: Secondary | ICD-10-CM | POA: Diagnosis present

## 2018-07-06 DIAGNOSIS — E559 Vitamin D deficiency, unspecified: Secondary | ICD-10-CM | POA: Diagnosis present

## 2018-07-06 DIAGNOSIS — I428 Other cardiomyopathies: Secondary | ICD-10-CM | POA: Diagnosis present

## 2018-07-06 DIAGNOSIS — F411 Generalized anxiety disorder: Secondary | ICD-10-CM | POA: Diagnosis present

## 2018-07-06 DIAGNOSIS — D649 Anemia, unspecified: Secondary | ICD-10-CM | POA: Diagnosis present

## 2018-07-06 DIAGNOSIS — I5042 Chronic combined systolic (congestive) and diastolic (congestive) heart failure: Secondary | ICD-10-CM | POA: Diagnosis present

## 2018-07-06 DIAGNOSIS — F419 Anxiety disorder, unspecified: Secondary | ICD-10-CM | POA: Diagnosis not present

## 2018-07-06 DIAGNOSIS — M1990 Unspecified osteoarthritis, unspecified site: Secondary | ICD-10-CM | POA: Diagnosis present

## 2018-07-06 DIAGNOSIS — M898X9 Other specified disorders of bone, unspecified site: Secondary | ICD-10-CM | POA: Diagnosis present

## 2018-07-06 DIAGNOSIS — Z6841 Body Mass Index (BMI) 40.0 and over, adult: Secondary | ICD-10-CM | POA: Diagnosis not present

## 2018-07-06 DIAGNOSIS — R55 Syncope and collapse: Secondary | ICD-10-CM | POA: Diagnosis not present

## 2018-07-06 DIAGNOSIS — Z1159 Encounter for screening for other viral diseases: Secondary | ICD-10-CM | POA: Diagnosis not present

## 2018-07-06 DIAGNOSIS — E11649 Type 2 diabetes mellitus with hypoglycemia without coma: Secondary | ICD-10-CM | POA: Diagnosis present

## 2018-07-06 DIAGNOSIS — J449 Chronic obstructive pulmonary disease, unspecified: Secondary | ICD-10-CM | POA: Diagnosis present

## 2018-07-06 DIAGNOSIS — E1140 Type 2 diabetes mellitus with diabetic neuropathy, unspecified: Secondary | ICD-10-CM | POA: Diagnosis present

## 2018-07-06 DIAGNOSIS — W19XXXA Unspecified fall, initial encounter: Secondary | ICD-10-CM | POA: Diagnosis present

## 2018-07-06 DIAGNOSIS — N2581 Secondary hyperparathyroidism of renal origin: Secondary | ICD-10-CM | POA: Diagnosis present

## 2018-07-06 DIAGNOSIS — G4733 Obstructive sleep apnea (adult) (pediatric): Secondary | ICD-10-CM | POA: Diagnosis present

## 2018-07-06 DIAGNOSIS — E7800 Pure hypercholesterolemia, unspecified: Secondary | ICD-10-CM | POA: Diagnosis present

## 2018-07-06 DIAGNOSIS — Y92531 Health care provider office as the place of occurrence of the external cause: Secondary | ICD-10-CM | POA: Diagnosis not present

## 2018-07-06 DIAGNOSIS — I132 Hypertensive heart and chronic kidney disease with heart failure and with stage 5 chronic kidney disease, or end stage renal disease: Secondary | ICD-10-CM | POA: Diagnosis present

## 2018-07-06 DIAGNOSIS — E1122 Type 2 diabetes mellitus with diabetic chronic kidney disease: Secondary | ICD-10-CM | POA: Diagnosis present

## 2018-07-06 LAB — BASIC METABOLIC PANEL
Anion gap: 13 (ref 5–15)
BUN: 43 mg/dL — ABNORMAL HIGH (ref 6–20)
CO2: 19 mmol/L — ABNORMAL LOW (ref 22–32)
Calcium: 7.4 mg/dL — ABNORMAL LOW (ref 8.9–10.3)
Chloride: 106 mmol/L (ref 98–111)
Creatinine, Ser: 5.18 mg/dL — ABNORMAL HIGH (ref 0.44–1.00)
GFR calc Af Amer: 10 mL/min — ABNORMAL LOW (ref >60)
GFR calc non Af Amer: 9 mL/min — ABNORMAL LOW (ref >60)
Glucose, Bld: 55 mg/dL — ABNORMAL LOW (ref 70–99)
Potassium: 5.1 mmol/L (ref 3.5–5.1)
Sodium: 138 mmol/L (ref 135–145)

## 2018-07-06 LAB — CBC
HCT: 34.4 % — ABNORMAL LOW (ref 36.0–46.0)
Hemoglobin: 10.9 g/dL — ABNORMAL LOW (ref 12.0–15.0)
MCH: 32.5 pg (ref 26.0–34.0)
MCHC: 31.7 g/dL (ref 30.0–36.0)
MCV: 102.7 fL — ABNORMAL HIGH (ref 80.0–100.0)
Platelets: 211 10*3/uL (ref 150–400)
RBC: 3.35 MIL/uL — ABNORMAL LOW (ref 3.87–5.11)
RDW: 13.9 % (ref 11.5–15.5)
WBC: 10.9 10*3/uL — ABNORMAL HIGH (ref 4.0–10.5)
nRBC: 0 % (ref 0.0–0.2)

## 2018-07-06 LAB — GLUCOSE, CAPILLARY
Glucose-Capillary: 130 mg/dL — ABNORMAL HIGH (ref 70–99)
Glucose-Capillary: 167 mg/dL — ABNORMAL HIGH (ref 70–99)
Glucose-Capillary: 183 mg/dL — ABNORMAL HIGH (ref 70–99)
Glucose-Capillary: 43 mg/dL — CL (ref 70–99)
Glucose-Capillary: 45 mg/dL — ABNORMAL LOW (ref 70–99)
Glucose-Capillary: 63 mg/dL — ABNORMAL LOW (ref 70–99)
Glucose-Capillary: 74 mg/dL (ref 70–99)
Glucose-Capillary: 82 mg/dL (ref 70–99)
Glucose-Capillary: 86 mg/dL (ref 70–99)
Glucose-Capillary: 87 mg/dL (ref 70–99)

## 2018-07-06 LAB — HIV ANTIBODY (ROUTINE TESTING W REFLEX): HIV Screen 4th Generation wRfx: NONREACTIVE

## 2018-07-06 MED ORDER — DOXERCALCIFEROL 4 MCG/2ML IV SOLN: 9.0000 ug | INTRAVENOUS | Status: DC

## 2018-07-06 MED ORDER — SEVELAMER CARBONATE 800 MG PO TABS
800.0000 mg | ORAL_TABLET | Freq: Three times a day (TID) | ORAL | Status: DC
  Administered 2018-07-06 – 2018-07-07 (×2): 800 mg via ORAL
  Filled 2018-07-06 (×2): qty 1

## 2018-07-06 MED ORDER — OCTREOTIDE ACETATE 50 MCG/ML IJ SOLN
50.0000 ug | Freq: Once | INTRAMUSCULAR | Status: AC
  Administered 2018-07-06: 50 ug via SUBCUTANEOUS
  Filled 2018-07-06: qty 1

## 2018-07-06 MED ORDER — RENA-VITE PO TABS
1.0000 | ORAL_TABLET | Freq: Every day | ORAL | Status: DC
  Administered 2018-07-06: 1 via ORAL
  Filled 2018-07-06: qty 1

## 2018-07-06 MED ORDER — SODIUM CHLORIDE 0.9 % IV SOLN
62.5000 mg | INTRAVENOUS | Status: DC
  Administered 2018-07-07: 62.5 mg via INTRAVENOUS
  Filled 2018-07-06 (×2): qty 5

## 2018-07-06 MED ORDER — DEXTROSE 50 % IV SOLN
INTRAVENOUS | Status: AC
  Administered 2018-07-06: 06:00:00
  Filled 2018-07-06: qty 50

## 2018-07-06 MED ORDER — CHLORHEXIDINE GLUCONATE CLOTH 2 % EX PADS: 6.0000 | MEDICATED_PAD | Freq: Every day | CUTANEOUS | Status: DC

## 2018-07-06 MED ORDER — SACUBITRIL-VALSARTAN 24-26 MG PO TABS
1.0000 | ORAL_TABLET | Freq: Every day | ORAL | Status: DC
  Filled 2018-07-06: qty 1

## 2018-07-06 NOTE — Progress Notes (Signed)
Patient refuses the use of CPAP 

## 2018-07-06 NOTE — Progress Notes (Addendum)
Pt's cbg 63 after eating full breakfast. Pt drinking juice at this time, MD Rai paged. Pt asymptomatic - reports no nausea, dizziness, vision changes.  Pt does have periorbital bruising and swelling around right eye, as well as skin tear with skin glue above eye and staples in hairline.

## 2018-07-06 NOTE — Consult Note (Addendum)
[facility] Renal Consultation Note    Indication for Consultation:  Management of ESRD/hemodialysis; anemia, hypertension/volume and secondary hyperparathyroidism PCP: Glendon Axe, MD Nephrologist: Genevie Ann  HPI: Audrey Olson is a 58 y.o. female with ESRD secondary to DM on HD since February 2019 who dialyzes TTS at [facility] with multiple medical issues outlined below who missed HD Thursday due to oversleeping and passed out while waiting at a doctor's office yesterday, fell and hit her head.  BS found to be profoundly low.  She has been having issues with neuropathy and not sleeping well.  Hypoglycemia was treated in the ED with recurrence of hypoglycemia which was treated with D5 infusion that has since been d/c.   Admission labs show TSH 0.415, hgb A1c 6.2 WBC up to 10.9 today hgb 10.9 K 5.1 Ca low at 7.4 with prior alb of 3.1 BUN 43 Cr 7.4 CXR clear.  Doesn't usually eat first thing in the morning but usually eats later and forgot.  She has been well otherwise without N, V, SOB, cough, fever or chills.  Adm to OBS bed.  Past Medical History:  Diagnosis Date  . Alpha-1-antitrypsin deficiency ([place])    pt is not aware of this diagnosis  . Anemia   . Anxiety   . Arthritis    Lower back and both hips with severe chronic pain and limited mobility  . Bell's palsy   . Chronic combined systolic and diastolic CHF (congestive heart failure) ([place])    a. 04/2013 Echo: EF 25-30%.  . Chronic pain    fibromyalgia, LBP  . COPD (chronic obstructive pulmonary disease) (HCC)    uses Albuterol daily as needed  . Degeneration of lumbar or lumbosacral intervertebral disc   . Diabetic neuropathy ([place])   . Generalized anxiety disorder   . Hepatomegaly    With splenomegaly, hemangioma of spleen  . History of colon polyps   . History of gastric ulcer   . History of MRSA infection 2005   Reported in 2005. Also had I&D of groin abscess 05/2013 that grew MRSA.  Marland Kitchen Hyperlipidemia    takes  Crestor daily as well as Lovaza  . Hypertension    takes Diovan daily  . Internal hemorrhoids   . LVH (left ventricular hypertrophy)   . Obstructive sleep apnea    a. mild OSA by 08/16/11 sleep study [facility]);  b. does not tolerate CPAP.  Marland Kitchen Osteoporosis   . Peripheral neuropathy    takes Lyrica daily  . Restless legs syndrome (RLS)   . Severe obesity (BMI 35.0-39.9) with comorbidity ([place])   . Tobacco abuse   . Type II diabetes mellitus ([place])    Insulin-dependent since 2008 - takes Novolog and Lantus daily as well as Metformin  . Vitamin D deficiency    takes Vit D 50000 units weekly   Past Surgical History:  Procedure Laterality Date  . APPENDECTOMY    . BREAST BIOPSY Left   . CARDIAC CATHETERIZATION  around 2007/2014   x 2  . CHOLECYSTECTOMY    . CHOLECYSTECTOMY    . COLONOSCOPY    . ESOPHAGOGASTRODUODENOSCOPY    . ganglion cyst removed Left   . MULTIPLE EXTRACTIONS WITH ALVEOLOPLASTY N/A 05/21/2013   Procedure: Extraction of tooth #'s 12,19, and 30 with alveoloplasty and gross debridement of remaining teeth;  Surgeon: [name], DDS;  Location: [facility];  Service: Oral Surgery;  Laterality: N/A;  . MULTIPLE TOOTH EXTRACTIONS  05/21/2013   TOOTH #12  #  19  WITH ALVEOLOPLASTY    . NASAL SINUS SURGERY     Family History  Problem Relation Age of Onset  . Hyperlipidemia Mother   . Hypertension Mother   . Lung disease Mother 68  . Hyperlipidemia Father   . Diabetes Mellitus II Father   . Hypertension Father   . Cancer Father    Social History:  reports that she has been smoking cigarettes. She started smoking about 44 years ago. She has a 82.00 pack-year smoking history. She has never used smokeless tobacco. She reports that she does not drink alcohol or use drugs. Allergies  Allergen Reactions  . Ibuprofen Anaphylaxis, Hives and Rash    Anaphylaxis with Sulfa only when combined with 800 mg Ibuprofen  . Sulfa Antibiotics Anaphylaxis and Hives     Anaphylaxis with Sulfa only when combined with 800 mg Ibuprofen    . Tape Hives and Rash       . Ropinirole Hives and Other (See Comments)    Hot flashes, also   . Saccharomyces Cerevisiae Diarrhea and Nausea And Vomiting   Prior to Admission medications   Medication Sig Start Date End Date Taking? Authorizing Provider  albuterol (PROAIR HFA) 108 (90 Base) MCG/ACT inhaler Inhale 2 puffs into the lungs 3 (three) times daily as needed for wheezing or shortness of breath.   Yes [provider]  aspirin EC 81 MG tablet Take 81 mg by mouth daily.   Yes [provider]  aspirin-sod bicarb-citric acid (ALKA-SELTZER) 325 MG TBEF tablet Take 325 mg by mouth every 6 (six) hours as needed (for indigestion).   Yes [provider]  calcium carbonate (TUMS EX) 750 MG chewable tablet Chew 1 tablet by mouth daily.   Yes [provider]  carvedilol (COREG) 25 MG tablet Take 25 mg by mouth 2 (two) times daily with a meal.   Yes [provider]  cinacalcet (SENSIPAR) 30 MG tablet Take 30 mg by mouth every Monday, Wednesday, and Friday.   Yes [provider]  clonazePAM (KLONOPIN) 1 MG tablet Take 1 mg by mouth at bedtime.    Yes [provider]  cyclobenzaprine (FLEXERIL) 5 MG tablet Take 5 mg by mouth 3 (three) times daily as needed for muscle spasms.   Yes [provider]  fexofenadine (ALLEGRA) 180 MG tablet Take 180 mg by mouth daily.   Yes [provider]  fluticasone (FLONASE) 50 MCG/ACT nasal spray Place 1-2 sprays into both nostrils daily as needed for allergies or rhinitis.    Yes [provider]  HYDROcodone-acetaminophen (NORCO) 10-325 MG per tablet Take 1 tablet by mouth 3 (three) times daily.    Yes [provider]  insulin aspart (NOVOLOG FLEXPEN) 100 UNIT/ML FlexPen Inject 3-8 Units into the skin See admin instructions. Inject 3-8 units into the skin three times a day before meals, per sliding scale    Yes [provider]  insulin glargine (LANTUS) 100 UNIT/ML injection Inject 30 Units into the skin 2 (two) times daily. Morning and bedtime   Yes [provider]  lidocaine (LIDODERM) 5 % Place 1 patch onto the skin daily as needed (for back pain). Remove & Discard patch within 12 hours or as directed by MD   Yes [provider]  magnesium oxide (MAG-OX) 400 MG tablet Take 400 mg by mouth daily.   Yes [provider]  naproxen sodium (ALEVE) 220 MG tablet Take 440 mg by mouth 2 (two) times daily as needed (  for pain).   Yes [provider]  nitroGLYCERIN (NITROSTAT) 0.4 MG SL tablet Place 0.4 mg under the tongue every 5 (five) minutes as needed for chest pain.   Yes [provider]  Omega-3 Fatty Acids (FISH OIL) 1000 MG CAPS Take 2,000 mg by mouth 2 (two) times daily with a meal.   Yes [provider]  omeprazole (PRILOSEC) 40 MG capsule Take 40 mg by mouth daily before breakfast.    Yes [provider]  PARoxetine (PAXIL) 20 MG tablet Take 20 mg by mouth daily as needed ("for panic attacks").    Yes [provider]  pregabalin (LYRICA) 150 MG capsule Take 150 mg by mouth daily as needed (for neuropathy).    Yes [provider]  rosuvastatin (CRESTOR) 40 MG tablet Take 40 mg by mouth at bedtime.    Yes [provider]  sacubitril-valsartan (ENTRESTO) 24-26 MG Take 1 tablet by mouth daily.   Yes [provider]  sevelamer (RENAGEL) 800 MG tablet Take 800 mg by mouth 3 (three) times daily before meals.   Yes [provider]  Vitamin D, Ergocalciferol, (DRISDOL) 50000 UNITS CAPS capsule Take 50,000 Units by mouth every Friday.    Yes [provider]   Current Facility-Administered Medications  Medication Dose Route Frequency Provider Last Rate Last Dose  . acetaminophen (TYLENOL) tablet 650 mg  650 mg Oral Q6H PRN Karmen Bongo, MD   650 mg at 07/05/18 1742   Or  .  acetaminophen (TYLENOL) suppository 650 mg  650 mg Rectal Q6H PRN Karmen Bongo, MD      . albuterol (PROVENTIL) (2.5 MG/3ML) 0.083% nebulizer solution 2.5 mg  2.5 mg Inhalation Q6H PRN Karmen Bongo, MD      . aspirin EC tablet 81 mg  81 mg Oral Daily Karmen Bongo, MD   81 mg at 07/06/18 1128  . calcium carbonate (dosed in mg elemental calcium) suspension 500 mg of elemental calcium  500 mg of elemental calcium Oral Q6H PRN Karmen Bongo, MD      . camphor-menthol [facility]) lotion 1 application  1 application Topical Y0D PRN Karmen Bongo, MD       And  . hydrOXYzine (ATARAX/VISTARIL) tablet 25 mg  25 mg Oral Q8H PRN Karmen Bongo, MD      . carvedilol (COREG) tablet 25 mg  25 mg Oral BID WC Karmen Bongo, MD   25 mg at 07/06/18 1128  . [START ON 07/07/2018] Chlorhexidine Gluconate Cloth 2 % PADS 6 each  6 each Topical Q0600 Alric Seton, PA-C      . clonazePAM [facility]) tablet 1 mg  1 mg Oral QHS PRN Karmen Bongo, MD      . cyclobenzaprine (FLEXERIL) tablet 5 mg  5 mg Oral TID PRN Karmen Bongo, MD      . docusate sodium [facility]) enema 283 mg  1 enema Rectal PRN Karmen Bongo, MD      . Derrill Memo ON 07/08/2018] doxercalciferol (HECTOROL) injection 9 mcg  9 mcg Intravenous Q M,W,F-HD Alric Seton, PA-C      . feeding supplement (NEPRO CARB STEADY) liquid 237 mL  237 mL Oral TID PRN Karmen Bongo, MD      . ferric gluconate (NULECIT) 62.5 mg in sodium chloride 0.9 % 100 mL IVPB  62.5 mg Intravenous Q Sat-HD Alric Seton, PA-C      . hydrALAZINE (APRESOLINE) tablet 25 mg  25 mg Oral TID Karmen Bongo, MD   25 mg at 07/06/18 1127  .  HYDROcodone-acetaminophen (NORCO) 10-325 MG per tablet 1 tablet  1 tablet Oral Q6H PRN Karmen Bongo, MD   1 tablet at 07/06/18 1123  . isosorbide mononitrate (IMDUR) 24 hr tablet 30 mg  30 mg Oral Daily Karmen Bongo, MD   30 mg at 07/06/18 1127  . multivitamin (RENA-VIT) tablet 1 tablet  1 tablet Oral QHS Alric Seton, PA-C      .  nicotine (NICODERM CQ - dosed in mg/24 hours) patch 14 mg  14 mg Transdermal Daily Karmen Bongo, MD   14 mg at 07/06/18 1130  . octreotide (SANDOSTATIN) injection 50 mcg  50 mcg Subcutaneous Once Rai, Ripudeep K, MD      . ondansetron (ZOFRAN) tablet 4 mg  4 mg Oral Q6H PRN Karmen Bongo, MD       Or  . ondansetron [facility]) injection 4 mg  4 mg Intravenous Q6H PRN Karmen Bongo, MD      . pantoprazole (PROTONIX) EC tablet 40 mg  40 mg Oral Daily Karmen Bongo, MD   40 mg at 07/06/18 1129  . pregabalin (LYRICA) capsule 150 mg  150 mg Oral QPM Karmen Bongo, MD   150 mg at 07/05/18 2113  . rosuvastatin (CRESTOR) tablet 40 mg  40 mg Oral Daily Karmen Bongo, MD   40 mg at 07/06/18 1129  . sevelamer carbonate (RENVELA) tablet 800 mg  800 mg Oral TID WC Alric Seton, PA-C      . sodium chloride flush (NS) 0.9 % injection 3 mL  3 mL Intravenous Q12H Karmen Bongo, MD   3 mL at 07/06/18 1130  . sorbitol 70 % solution 30 mL  30 mL Oral PRN Karmen Bongo, MD      . zolpidem [facility]) tablet 5 mg  5 mg Oral QHS PRN Karmen Bongo, MD       Labs: Basic Metabolic Panel: Recent Labs  Lab 07/05/18 1208 07/06/18 0329  NA 140 138  K 4.5 5.1  CL 106 106  CO2 24 19*  GLUCOSE 119* 55*  BUN 39* 43*  CREATININE 4.91* 5.18*  CALCIUM 7.7* 7.4*   Liver Function Tests: Recent Labs  Lab 07/05/18 1208  AST 11*  ALT 11  ALKPHOS 118  BILITOT 0.3  PROT 6.0*  ALBUMIN 3.1*   No results for input(s): LIPASE, AMYLASE in the last 168 hours. No results for input(s): AMMONIA in the last 168 hours. CBC: Recent Labs  Lab 07/05/18 1208 07/06/18 0329  WBC 9.6 10.9*  NEUTROABS 7.0  --   HGB 11.6* 10.9*  HCT 37.1 34.4*  MCV 106.0* 102.7*  PLT 191 211   Cardiac Enzymes: No results for input(s): CKTOTAL, CKMB, CKMBINDEX, TROPONINI in the last 168 hours. CBG: Recent Labs  Lab 07/06/18 0412 07/06/18 0538 07/06/18 0623 07/06/18 0757 07/06/18 1136  GLUCAP 86 45* 87 63* 43*   Iron  Studies: No results for input(s): IRON, TIBC, TRANSFERRIN, FERRITIN in the last 72 hours. Studies/Results: Dg Chest 2 View  Result Date: 07/05/2018 CLINICAL DATA:  Syncope, status post fall EXAM: CHEST - 2 VIEW COMPARISON:  05/03/2018 FINDINGS: There is no focal parenchymal opacity. There is no pleural effusion or pneumothorax. The heart and mediastinal contours are unremarkable. The osseous structures are unremarkable. IMPRESSION: No active cardiopulmonary disease. Electronically Signed   By: Kathreen Devoid   On: 07/05/2018 12:44   Ct Head Wo Contrast  Result Date: 07/05/2018 CLINICAL DATA:  Syncopal episode.  Fall.  Trauma to head. EXAM: CT HEAD WITHOUT CONTRAST TECHNIQUE: Contiguous  axial images were obtained from the base of the skull through the vertex without intravenous contrast. COMPARISON:  CT head without contrast 02/21/2016 FINDINGS: Brain: No acute infarct, hemorrhage, or mass lesion is present. 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 No significant white matter lesions are present. Basal ganglia are intact. No significant extraaxial fluid collection is present. The brainstem and cerebellum are within normal limits. Vascular: Atherosclerotic calcifications are present within the cavernous internal carotid arteries. There is no hyperdense vessel. Skull: A large right supraorbital scalp hematoma and laceration is present. No radiopaque foreign body is present. There is no underlying fracture. Sinuses/Orbits: The paranasal sinuses and mastoid air cells are clear. The globes and orbits are within normal limits. IMPRESSION: 1. Large right supraorbital scalp hematoma and laceration without underlying fracture. 2. No acute intracranial abnormality. 3. 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 Electronically Signed   By: San Morelle M.D.   On: 07/05/2018 13:45    ROS: As per HPI otherwise negative.  Physical Exam: Vitals:   07/05/18 1817 07/05/18 2049  07/06/18 0500 07/06/18 0536  BP: (!) 168/108 (!) 162/76  (!) 126/52  Pulse: (!) 103 91  90  Resp: 20     Temp: 97.8 F (36.6 C) 98.2 F (36.8 C)  99.1 F (37.3 C)  TempSrc: Oral Oral  Oral  SpO2: 100% 97%  90%  Weight:   105.4 kg   Height:         General: obese WF appears older than chronological age NAD sitting in chari Head: NCAT right eye and right temple  ecchymosis swelling, staples scaple MMM Neck: Supple.  Lungs: dim BS throughout obese poor expansionBreathing is unlabored. Heart: RRR with S1 S2.  Abdomen: soft NT + BS obese Lower extremities:without edema or ischemic changes, no open wounds tr LLE edema Neuro: A & O  X 3. Moves all extremities spontaneously. Psych:  Responds to questions appropriately with a normal affect. Dialysis Access: left AVF + bruit  Dialysis Orders: [place] 3.5 hr Otpiflux 200 350/700 no heparin 2K 2.5 Ca bath 350/700 var NA 147 linear hectorol 9 weekly Fe , no ESA EDW 236# = 107.2 kg  Assessment/Plan: 1.  Syncope - presumed secondary to hypoglycemic episode - on bid Lantus 30; check orthostatics pre HD- per primary 2.  ESRD -  TTS - [place] - last HD Tuesday - below EDW by hospital weights - reweigh pre HD and adjust goal appropriately. No heparin HD 3. Hypertension/volume  - on coreg 25 bid and entresto- initially hypertensive - check orthostatics pre HD today CXR clear 4. Anemia  - hgb 10.9 - only on weekly Fe - no ESA at present 5. Metabolic bone disease -  Hypocalcemic - missed Hectorol 9 and dialysis Thursday likely contributory. Hold sensipar 30 for now due to low Ca; continue renvela 6. Nutrition - regular diet for now due to hypoglycemia -   Myriam Jacobson, PA-C [place] [phone] 07/06/2018, 1:37 PM   Pt seen, examined and agree w A/P as above.  Kelly Splinter  MD 07/06/2018, 5:30 PM

## 2018-07-06 NOTE — Progress Notes (Signed)
Most recent fingerstick ~140mg /dL, not crossing over. Will continue to monitor. Touched base with hemodialysis - she is on the list but it may be later this evening that she can get treatment.

## 2018-07-06 NOTE — Progress Notes (Signed)
[facility]                                                                              Patient Demographics  Audrey Olson, is a 58 y.o. female, DOB - 06-07-1960, WNI:[id number]  Admit date - 07/05/2018   Admitting Physician Karmen Bongo, MD  Outpatient Primary MD for the patient is Glendon Axe, MD  Outpatient specialists:   LOS - 0  days   Medical records reviewed and are as summarized below:    Chief Complaint  Patient presents with  . Loss of Consciousness       Brief summary   Patient is a 58 year old female with history of IDDM, obesity, COPD, tobacco use, chronic systolic CHF with EF of 25 to 30%, hypertension, ESRD on TTS schedule presented with syncopal episode.  Patient reported that she had missed her hemodialysis on Thursday, 07/04/2018.  Patient reported that on the day of admission she was waiting for her doctor's appointment and passed out, lasting she remembers was EMS getting her off the floor.  She hit her head, per EMS patient was hypoglycemic.  Patient reported that she did not miss her insulin but forgot to eat. In ED, patient CBG was 11 and needed 2 A of D50 with improvement then dropped back to 20s.  Patient was placed on D5 infusion and admitted for further work-up.   Assessment & Plan    Principal Problem:   Syncope -Likely due to profound hypoglycemia, fall with head trauma, staples intact -Currently alert and oriented, denies any dizziness -Continue telemetry, no arrhythmias, no metabolic abnormalities except creatinine 5.1  Active Problems: Profound hypoglycemia in the setting of diabetes mellitus type 2, IDDM with complications, ESRD  -Patient states that she takes NovoLog sliding scale and Lantus 30 units twice a day at home.  Usually CBGs run 150-200 in the mornings. -Patient had multiple amps of D50, D5 drip, continues to have low blood sugars.  CBG 63 after eating for breakfast, rechecked again and down to 43 -Patient  denies any surreptitious oral hypoglycemic use, states that she did take her long-acting insulin as prescribed but did not eat yesterday -Given patient continues to have profound hypoglycemia, will give octreotide 26mcg x1 for refractory hypoglycemia caused by glargine.  Check proinsulin, insulin levels. -Changed to regular diet    Chronic combined systolic and diastolic CHF (congestive heart failure) (HCC) -Currently no pulmonary edema or worsening shortness of breath. -Volume management with HD, nephrology notified -2D echo 02/2016 showed EF of 30 to 35%    ESRD (end stage renal disease) on dialysis [facility]) -TTS schedule, patient also missed Thursday HD session.  Nephrology notified, will need HD today.    Tobacco abuse -Counseled strongly on tobacco cessation, continue nicotine patch    Obstructive sleep apnea -Continue CPAP    Severe obesity (BMI 35.0-39.9) with comorbidity ([place]) -Patient counseled on diet and weight control    Hypertension -Continue Coreg, hydralazine    Hypercholesterolemia -Continue Crestor     Code Status: Full code DVT Prophylaxis:  SCD's Family Communication: Discussed in detail with the patient, all imaging results, lab results explained to the patient  Disposition Plan: Possibly DC home in a.m., will need HD today, CBG still very low for safe discharge home  Time Spent in minutes 35 minutes  Procedures:  None  Consultants:   Nephrology  Antimicrobials:   Anti-infectives (From admission, onward)   None          Medications  Scheduled Meds: . aspirin EC  81 mg Oral Daily  . carvedilol  25 mg Oral BID WC  . hydrALAZINE  25 mg Oral TID  . isosorbide mononitrate  30 mg Oral Daily  . nicotine  14 mg Transdermal Daily  . octreotide  50 mcg Subcutaneous Once  . pantoprazole  40 mg Oral Daily  . pregabalin  150 mg Oral QPM  . rosuvastatin  40 mg Oral Daily  . sodium chloride flush  3 mL Intravenous Q12H   Continuous Infusions:  PRN Meds:.acetaminophen **OR** acetaminophen, albuterol, calcium carbonate (dosed in mg elemental calcium), camphor-menthol **AND** hydrOXYzine, clonazePAM, cyclobenzaprine, docusate sodium, feeding supplement (NEPRO CARB STEADY), HYDROcodone-acetaminophen, ondansetron **OR** ondansetron (ZOFRAN) IV, sorbitol, zolpidem      Subjective:   Audrey Olson was seen and examined today.  CBGs continues to be low, despite eating full breakfast CBG 63, then dropped down to 43 after rechecking. Patient denies dizziness, chest pain, shortness of breath, abdominal pain, N/V/D/C, new weakness, numbess, tingling.  No fevers.  Objective:   Vitals:   07/05/18 1817 07/05/18 2049 07/06/18 0500 07/06/18 0536  BP: (!) 168/108 (!) 162/76  (!) 126/52  Pulse: (!) 103 91  90  Resp: 20     Temp: 97.8 F (36.6 C) 98.2 F (36.8 C)  99.1 F (37.3 C)  TempSrc: Oral Oral  Oral  SpO2: 100% 97%  90%  Weight:   105.4 kg   Height:        Intake/Output Summary (Last 24 hours) at 07/06/2018 1234 Last data filed at 07/06/2018 0845 Gross per 24 hour  Intake 1905.42 ml  Output -  Net 1905.42 ml     Wt Readings from Last 3 Encounters:  07/06/18 105.4 kg  08/26/15 100.7 kg  09/10/13 100.2 kg     Exam  General: Alert and oriented x 3, NAD  Eyes:   HEENT:  Atraumatic, normocephalic, normal oropharynx, staples intact.  Cardiovascular: S1 S2 auscultated,Regular rate and rhythm.  Respiratory: Clear to auscultation bilaterally, no wheezing  Gastrointestinal: Soft, nontender, nondistended, + bowel sounds  Ext: no pedal edema bilaterally  Neuro: No new deficits  Musculoskeletal: No digital cyanosis, clubbing  Skin: Abrasion on right forehead, staples intact on midline scalp  Psych: Normal affect and demeanor, alert and oriented x3    Data Reviewed:  I have personally reviewed following labs and imaging studies  Micro Results Recent Results (from the past 240 hour(s))  SARS Coronavirus 2  (CEPHEID - Performed in [facility] lab), Hosp Order     Status: None   Collection Time: 07/05/18  5:43 PM  Result Value Ref Range Status   SARS Coronavirus 2 NEGATIVE NEGATIVE Final    Comment: (NOTE) If result is NEGATIVE SARS-CoV-2 target nucleic acids are NOT DETECTED. The SARS-CoV-2 RNA is generally detectable in upper and lower  respiratory specimens during the acute phase of infection. The lowest  concentration of SARS-CoV-2 viral copies this assay can detect is 250  copies / mL. A negative result does not preclude SARS-CoV-2 infection  and should not be used as the sole basis for treatment or other  patient management decisions.  A negative  result may occur with  improper specimen collection / handling, submission of specimen other  than nasopharyngeal swab, presence of viral mutation(s) within the  areas targeted by this assay, and inadequate number of viral copies  (<250 copies / mL). A negative result must be combined with clinical  observations, patient history, and epidemiological information. If result is POSITIVE SARS-CoV-2 target nucleic acids are DETECTED. The SARS-CoV-2 RNA is generally detectable in upper and lower  respiratory specimens dur ing the acute phase of infection.  Positive  results are indicative of active infection with SARS-CoV-2.  Clinical  correlation with patient history and other diagnostic information is  necessary to determine patient infection status.  Positive results do  not rule out bacterial infection or co-infection with other viruses. If result is PRESUMPTIVE POSTIVE SARS-CoV-2 nucleic acids MAY BE PRESENT.   A presumptive positive result was obtained on the submitted specimen  and confirmed on repeat testing.  While 2019 novel coronavirus  (SARS-CoV-2) nucleic acids may be present in the submitted sample  additional confirmatory testing may be necessary for epidemiological  and / or clinical management purposes  to differentiate  between  SARS-CoV-2 and other Sarbecovirus currently known to infect humans.  If clinically indicated additional testing with an alternate test  methodology [phone]) is advised. The SARS-CoV-2 RNA is generally  detectable in upper and lower respiratory sp ecimens during the acute  phase of infection. The expected result is Negative. Fact Sheet for Patients:  StrictlyIdeas.no Fact Sheet for Healthcare Providers: BankingDealers.co.za This test is not yet approved or cleared by the [place] FDA and has been authorized for detection and/or diagnosis of SARS-CoV-2 by FDA under an Emergency Use Authorization (EUA).  This EUA will remain in effect (meaning this test can be used) for the duration of the COVID-19 declaration under Section 564(b)(1) of the Act, 21 U.S.C. section 360bbb-3(b)(1), unless the authorization is terminated or revoked sooner. Performed at [facility], [place] [address], [place], [place] [address]     Radiology Reports Dg Chest 2 View  Result Date: 07/05/2018 CLINICAL DATA:  Syncope, status post fall EXAM: CHEST - 2 VIEW COMPARISON:  05/03/2018 FINDINGS: There is no focal parenchymal opacity. There is no pleural effusion or pneumothorax. The heart and mediastinal contours are unremarkable. The osseous structures are unremarkable. IMPRESSION: No active cardiopulmonary disease. Electronically Signed   By: Kathreen Devoid   On: 07/05/2018 12:44   Ct Head Wo Contrast  Result Date: 07/05/2018 CLINICAL DATA:  Syncopal episode.  Fall.  Trauma to head. EXAM: CT HEAD WITHOUT CONTRAST TECHNIQUE: Contiguous axial images were obtained from the base of the skull through the vertex without intravenous contrast. COMPARISON:  CT head without contrast 02/21/2016 FINDINGS: Brain: No acute infarct, hemorrhage, or mass lesion is present. 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 No significant white matter lesions are present. Basal  ganglia are intact. No significant extraaxial fluid collection is present. The brainstem and cerebellum are within normal limits. Vascular: Atherosclerotic calcifications are present within the cavernous internal carotid arteries. There is no hyperdense vessel. Skull: A large right supraorbital scalp hematoma and laceration is present. No radiopaque foreign body is present. There is no underlying fracture. Sinuses/Orbits: The paranasal sinuses and mastoid air cells are clear. The globes and orbits are within normal limits. IMPRESSION: 1. Large right supraorbital scalp hematoma and laceration without underlying fracture. 2. No acute intracranial abnormality. 3. 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 Electronically Signed  By: San Morelle M.D.   On: 07/05/2018 13:45    Lab Data:  CBC: Recent Labs  Lab 07/05/18 1208 07/06/18 0329  WBC 9.6 10.9*  NEUTROABS 7.0  --   HGB 11.6* 10.9*  HCT 37.1 34.4*  MCV 106.0* 102.7*  PLT 191 502   Basic Metabolic Panel: Recent Labs  Lab 07/05/18 1208 07/06/18 0329  NA 140 138  K 4.5 5.1  CL 106 106  CO2 24 19*  GLUCOSE 119* 55*  BUN 39* 43*  CREATININE 4.91* 5.18*  CALCIUM 7.7* 7.4*   GFR: Estimated Creatinine Clearance: 13.8 mL/min (A) (by C-G formula based on SCr of 5.18 mg/dL (H)). Liver Function Tests: Recent Labs  Lab 07/05/18 1208  AST 11*  ALT 11  ALKPHOS 118  BILITOT 0.3  PROT 6.0*  ALBUMIN 3.1*   No results for input(s): LIPASE, AMYLASE in the last 168 hours. No results for input(s): AMMONIA in the last 168 hours. Coagulation Profile: No results for input(s): INR, PROTIME in the last 168 hours. Cardiac Enzymes: No results for input(s): CKTOTAL, CKMB, CKMBINDEX, TROPONINI in the last 168 hours. BNP (last 3 results) No results for input(s): PROBNP in the last 8760 hours. HbA1C: Recent Labs    07/05/18 1618  HGBA1C 6.2*   CBG: Recent Labs  Lab 07/06/18 0412 07/06/18 0538  07/06/18 0623 07/06/18 0757 07/06/18 1136  GLUCAP 86 45* 87 63* 43*   Lipid Profile: No results for input(s): CHOL, HDL, LDLCALC, TRIG, CHOLHDL, LDLDIRECT in the last 72 hours. Thyroid Function Tests: Recent Labs    07/05/18 1616  TSH 0.415   Anemia Panel: No results for input(s): VITAMINB12, FOLATE, FERRITIN, TIBC, IRON, RETICCTPCT in the last 72 hours. Urine analysis:    Component Value Date/Time   COLORURINE YELLOW 05/22/2013 0600   APPEARANCEUR CLOUDY (A) 05/22/2013 0600   LABSPEC 1.020 05/22/2013 0600   PHURINE 5.0 05/22/2013 0600   GLUCOSEU NEGATIVE 05/22/2013 0600   HGBUR TRACE (A) 05/22/2013 0600   BILIRUBINUR NEGATIVE 05/22/2013 0600   KETONESUR NEGATIVE 05/22/2013 0600   PROTEINUR >300 (A) 05/22/2013 0600   UROBILINOGEN 0.2 05/22/2013 0600   NITRITE NEGATIVE 05/22/2013 0600   LEUKOCYTESUR TRACE (A) 05/22/2013 0600     Ripudeep Rai M.D. [facility] 07/06/2018, 12:34 PM  Pager: [phone] Between 7am to 7pm - call Pager - [phone]  After 7pm go to [email or web address] - password [email or web address]  Call night coverage person covering after 7pm

## 2018-07-06 NOTE — Progress Notes (Signed)
Patient consumed her snack of tuna salad and crackers.  Patient also ate two packs of graham crackers but glucose still in the low 80's.  D5 is still infusing at 100 mL/hr. Will continue to monitor the patient and notify as needed.

## 2018-07-06 NOTE — Progress Notes (Signed)
Checked CBG, 43. Pt asymptomatic, drinking juice and eating peanut butter at this time. Will recheck

## 2018-07-06 NOTE — Progress Notes (Signed)
Patient CBG dropped to 45 when the D5 was discontinued.  Will treat the hypoglycemic event with D50 25 mL due to patient not wanting to eat or drink at this time. Osvaldo Human, NP.  Will continue to monitor the patient.

## 2018-07-07 LAB — BASIC METABOLIC PANEL
Anion gap: 13 (ref 5–15)
BUN: 21 mg/dL — ABNORMAL HIGH (ref 6–20)
CO2: 26 mmol/L (ref 22–32)
Calcium: 8 mg/dL — ABNORMAL LOW (ref 8.9–10.3)
Chloride: 95 mmol/L — ABNORMAL LOW (ref 98–111)
Creatinine, Ser: 3.34 mg/dL — ABNORMAL HIGH (ref 0.44–1.00)
GFR calc Af Amer: 17 mL/min — ABNORMAL LOW (ref >60)
GFR calc non Af Amer: 15 mL/min — ABNORMAL LOW (ref >60)
Glucose, Bld: 102 mg/dL — ABNORMAL HIGH (ref 70–99)
Potassium: 3.9 mmol/L (ref 3.5–5.1)
Sodium: 134 mmol/L — ABNORMAL LOW (ref 135–145)

## 2018-07-07 LAB — GLUCOSE, CAPILLARY
Glucose-Capillary: 103 mg/dL — ABNORMAL HIGH (ref 70–99)
Glucose-Capillary: 144 mg/dL — ABNORMAL HIGH (ref 70–99)
Glucose-Capillary: 94 mg/dL (ref 70–99)

## 2018-07-07 MED ORDER — INSULIN GLARGINE 100 UNIT/ML ~~LOC~~ SOLN: 10.0000 [IU] | Freq: Two times a day (BID) | SUBCUTANEOUS | 11 refills | Status: AC

## 2018-07-07 NOTE — Plan of Care (Signed)

## 2018-07-07 NOTE — Progress Notes (Signed)
Pt to be discharged today. Pt says her ride doesn't get off work until 1430. Will continue to monitor.

## 2018-07-07 NOTE — Progress Notes (Signed)
Inpatient Diabetes Program Recommendations  AACE/ADA: New Consensus Statement on Inpatient Glycemic Control (2015)  Target Ranges:  Prepandial:   less than 140 mg/dL      Peak postprandial:   less than 180 mg/dL (1-2 hours)      Critically ill patients:  140 - 180 mg/dL   Lab Results  Component Value Date   GLUCAP 94 07/07/2018   HGBA1C 6.2 (H) 07/05/2018    Review of Glycemic Control  Diabetes history: DM 2 Outpatient Diabetes medications: Lantus 30 units bid, Novolog 3-8 units tid before meals Current orders for Inpatient glycemic control:  None   Spoke with patient over the phone regarding hypoglycemia and glucose control at home. Patient reports Having a PCP and sees Dr. Posey Pronto, Endocrinology every 3 months. Patient reports she was rushing out the door for HD and forgot to eat. She said the hypoglycemia has happened before when she did not eat and had taken her insulin. Overall patient reports hyperglycemia at home. Patient is due for another visit within the next month with Dr. Posey Pronto.  Discussed plan of care with Dr. Tana Coast for discharge as patient has not required insulin while here.  Per Dr. Josem Kaufmann conversation with patient, her insulin doses are the same from prior to the start of her needing HD to now. Patient reports to me her glucose trends are the same on HD days versus off days (usuallu they are lower on HD days).  Patient to be d/c'd today and will follow up with her Endocrinologist.  Thanks,  Tama Headings RN, MSN, BC-ADM Inpatient Diabetes Coordinator Team Pager [phone] (8a-5p)

## 2018-07-07 NOTE — Discharge Summary (Signed)
Physician Discharge Summary   Patient ID: Audrey Olson MRN: [id number] DOB/AGE: 57-Aug-1962 46 y.o.  Admit date: 07/05/2018 Discharge date: 07/07/2018  Primary Care Physician:  Glendon Axe, MD   Recommendations for Outpatient Follow-up:  1. Follow up with PCP in 1-2 weeks 2. Patient recommended to follow-up with her endocrinologist, Dr. Posey Pronto this coming week.  She will call on Tuesday, 07/09/2018 for further advice and follow-up appointment.  Home Health: None Equipment/Devices:   Discharge Condition: stable CODE STATUS: FULL  Diet recommendation: Carb modified diet   Discharge Diagnoses:    . Vasovagal syncope likely due to profound hypoglycemia . Profound hypoglycemia due to insulin . Chronic combined systolic and diastolic CHF (congestive heart failure) ([place]) . Hypercholesterolemia . Hypertension . Severe obesity (BMI 35.0-39.9) with comorbidity ([place]) . Tobacco abuse . Anxiety . Obstructive sleep apnea ESRD on hemodialysis, TTS   Consults: Nephrology Diabetic coordinator    Allergies:   Allergies  Allergen Reactions  . Ibuprofen Anaphylaxis, Hives and Rash    Anaphylaxis with Sulfa only when combined with 800 mg Ibuprofen  . Sulfa Antibiotics Anaphylaxis and Hives    Anaphylaxis with Sulfa only when combined with 800 mg Ibuprofen    . Tape Hives and Rash       . Ropinirole Hives and Other (See Comments)    Hot flashes, also   . Saccharomyces Cerevisiae Diarrhea and Nausea And Vomiting     DISCHARGE MEDICATIONS: Allergies as of 07/07/2018      Reactions   Ibuprofen Anaphylaxis, Hives, Rash   Anaphylaxis with Sulfa only when combined with 800 mg Ibuprofen   Sulfa Antibiotics Anaphylaxis, Hives   Anaphylaxis with Sulfa only when combined with 800 mg Ibuprofen   Tape Hives, Rash      Ropinirole Hives, Other (See Comments)   Hot flashes, also   Saccharomyces Cerevisiae Diarrhea, Nausea And Vomiting      Medication List    TAKE these  medications   aspirin EC 81 MG tablet Take 81 mg by mouth daily.   aspirin-sod bicarb-citric acid 325 MG Tbef tablet Commonly known as:  ALKA-SELTZER Take 325 mg by mouth every 6 (six) hours as needed (for indigestion).   calcium carbonate 750 MG chewable tablet Commonly known as:  TUMS EX Chew 1 tablet by mouth daily.   carvedilol 25 MG tablet Commonly known as:  COREG Take 25 mg by mouth 2 (two) times daily with a meal.   cinacalcet 30 MG tablet Commonly known as:  SENSIPAR Take 30 mg by mouth every Monday, Wednesday, and Friday.   clonazePAM 1 MG tablet Commonly known as:  KLONOPIN Take 1 mg by mouth at bedtime.   cyclobenzaprine 5 MG tablet Commonly known as:  FLEXERIL Take 5 mg by mouth 3 (three) times daily as needed for muscle spasms.   Entresto 24-26 MG Generic drug:  sacubitril-valsartan Take 1 tablet by mouth daily.   fexofenadine 180 MG tablet Commonly known as:  ALLEGRA Take 180 mg by mouth daily.   Fish Oil 1000 MG Caps Take 2,000 mg by mouth 2 (two) times daily with a meal.   fluticasone 50 MCG/ACT nasal spray Commonly known as:  FLONASE Place 1-2 sprays into both nostrils daily as needed for allergies or rhinitis.   HYDROcodone-acetaminophen 10-325 MG tablet Commonly known as:  NORCO Take 1 tablet by mouth 3 (three) times daily.   insulin glargine 100 UNIT/ML injection Commonly known as:  LANTUS Inject 0.1 mLs (10 Units total) into the skin 2 (two)  times daily. Morning and bedtime. Please see discharge instructions. Start taking on:  Jul 08, 2018 What changed:    how much to take  additional instructions   lidocaine 5 % Commonly known as:  LIDODERM Place 1 patch onto the skin daily as needed (for back pain). Remove & Discard patch within 12 hours or as directed by MD   magnesium oxide 400 MG tablet Commonly known as:  MAG-OX Take 400 mg by mouth daily.   naproxen sodium 220 MG tablet Commonly known as:  ALEVE Take 440 mg by mouth 2  (two) times daily as needed (for pain).   nitroGLYCERIN 0.4 MG SL tablet Commonly known as:  NITROSTAT Place 0.4 mg under the tongue every 5 (five) minutes as needed for chest pain.   NovoLOG FlexPen 100 UNIT/ML FlexPen Generic drug:  insulin aspart Inject 3-8 Units into the skin See admin instructions. Inject 3-8 units into the skin three times a day before meals, per sliding scale   omeprazole 40 MG capsule Commonly known as:  PRILOSEC Take 40 mg by mouth daily before breakfast.   PARoxetine 20 MG tablet Commonly known as:  PAXIL Take 20 mg by mouth daily as needed ("for panic attacks").   pregabalin 150 MG capsule Commonly known as:  LYRICA Take 150 mg by mouth daily as needed (for neuropathy).   ProAir HFA 108 (90 Base) MCG/ACT inhaler Generic drug:  albuterol Inhale 2 puffs into the lungs 3 (three) times daily as needed for wheezing or shortness of breath.   rosuvastatin 40 MG tablet Commonly known as:  CRESTOR Take 40 mg by mouth at bedtime.   sevelamer 800 MG tablet Commonly known as:  RENAGEL Take 800 mg by mouth 3 (three) times daily before meals.   Vitamin D (Ergocalciferol) 1.25 MG (50000 UT) Caps capsule Commonly known as:  DRISDOL Take 50,000 Units by mouth every Friday.        Brief H and P: For complete details please refer to admission H and P, but in brief Patient is a 58 year old female with history of IDDM, obesity, COPD, tobacco use, chronic systolic CHF with EF of 25 to 30%, hypertension, ESRD on TTS schedule presented with syncopal episode.  Patient reported that she had missed her hemodialysis on Thursday, 07/04/2018.  Patient reported that on the day of admission she was waiting for her doctor's appointment and passed out, lasting she remembers was EMS getting her off the floor.  She hit her head, per EMS patient was hypoglycemic.  Patient reported that she did not miss her insulin but forgot to eat. In ED, patient CBG was 11 and needed 2 A of D50  with improvement then dropped back to 20s.  Patient was placed on D5 infusion and admitted for further work-up. COVID-19 test negative  Hospital Course:   Syncope -Likely due to profound hypoglycemia, fall with head trauma, staples intact -Currently alert and oriented, denies any dizziness -No arrhythmias on telemetry, no metabolic abnormalities   Profound hypoglycemia in the setting of diabetes mellitus type 2, IDDM with complications, ESRD  -Patient states that she takes NovoLog sliding scale and Lantus 30 units twice a day at home.  Usually CBGs run 150-200 in the mornings. -Patient had multiple amps of D50 during admission, then placed on D5 drip, however she continued to have low blood sugars.  CBG 63 even after eating for breakfast, rechecked again and down to 43, on 5/23 AM -Patient denied any surreptitious oral hypoglycemic use, states that  she did take her long-acting insulin as prescribed but did not eat in the morning. -Given persistent profound hypoglycemia, patient was given octreotide 71mcg x1 for refractory hypoglycemia caused by glargine.   -CBGs are better today, patient states however at home blood sugars run in 200s.  Recommended to restart Lantus tomorrow on 5/25, 10 units twice daily instead of 30 units twice daily but will check fasting blood sugar in a.m.  Patient was given detailed instructions regarding restarting Lantus and increasing dose if CBGs are elevated.  She was also strongly recommended to call her endocrinologist office on 07/09/2018 a.m. for urgent follow-up appointment for insulin regimen adjustment especially her being on dialysis.    Chronic combined systolic and diastolic CHF (congestive heart failure) (HCC) -Currently no pulmonary edema or worsening shortness of breath. -Volume management with HD, nephrology notified -2D echo 02/2016 showed EF of 30 to 35%    ESRD (end stage renal disease) on dialysis [facility]) -TTS schedule, patient also missed  Thursday HD session.  Neurology was consulted, patient underwent hemodialysis today, next session on Tuesday.    Tobacco abuse -Counseled strongly on tobacco cessation, continue nicotine patch    Obstructive sleep apnea -Continue CPAP    Severe obesity (BMI 35.0-39.9) with comorbidity ([place]) -Patient counseled on diet and weight control    Hypertension -Continue Coreg, hydralazine    Hypercholesterolemia -Continue Crestor  Day of Discharge S: No acute complaints, feeling better, at baseline.  CBGs stable, no hypoglycemia overnight and this morning.  Wants to go home  BP (!) 150/68   Pulse 92   Temp 98.2 F (36.8 C) (Oral)   Resp 18   Ht 5\' 2"  (1.575 m)   Wt 106.9 kg   SpO2 93%   BMI 43.10 kg/m   Physical Exam: General: Alert and awake oriented x3 not in any acute distress. HEENT: anicteric sclera, pupils reactive to light and accommodation CVS: S1-S2 clear no murmur rubs or gallops Chest: clear to auscultation bilaterally, no wheezing rales or rhonchi Abdomen: soft nontender, nondistended, normal bowel sounds Extremities: no cyanosis, clubbing or edema noted bilaterally Neuro: Cranial nerves II-XII intact, no focal neurological deficits   The results of significant diagnostics from this hospitalization (including imaging, microbiology, ancillary and laboratory) are listed below for reference.      Procedures/Studies:  Dg Chest 2 View  Result Date: 07/05/2018 CLINICAL DATA:  Syncope, status post fall EXAM: CHEST - 2 VIEW COMPARISON:  05/03/2018 FINDINGS: There is no focal parenchymal opacity. There is no pleural effusion or pneumothorax. The heart and mediastinal contours are unremarkable. The osseous structures are unremarkable. IMPRESSION: No active cardiopulmonary disease. Electronically Signed   By: Kathreen Devoid   On: 07/05/2018 12:44   Ct Head Wo Contrast  Result Date: 07/05/2018 CLINICAL DATA:  Syncopal episode.  Fall.  Trauma to head. EXAM: CT HEAD  WITHOUT CONTRAST TECHNIQUE: Contiguous axial images were obtained from the base of the skull through the vertex without intravenous contrast. COMPARISON:  CT head without contrast 02/21/2016 FINDINGS: Brain: No acute infarct, hemorrhage, or mass lesion is present. 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 No significant white matter lesions are present. Basal ganglia are intact. No significant extraaxial fluid collection is present. The brainstem and cerebellum are within normal limits. Vascular: Atherosclerotic calcifications are present within the cavernous internal carotid arteries. There is no hyperdense vessel. Skull: A large right supraorbital scalp hematoma and laceration is present. No radiopaque foreign body is present. There is no underlying fracture. Sinuses/Orbits:  The paranasal sinuses and mastoid air cells are clear. The globes and orbits are within normal limits. IMPRESSION: 1. Large right supraorbital scalp hematoma and laceration without underlying fracture. 2. No acute intracranial abnormality. 3. 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 Electronically Signed   By: San Morelle M.D.   On: 07/05/2018 13:45       LAB RESULTS: Basic Metabolic Panel: Recent Labs  Lab 07/06/18 0329 07/07/18 0908  NA 138 134*  K 5.1 3.9  CL 106 95*  CO2 19* 26  GLUCOSE 55* 102*  BUN 43* 21*  CREATININE 5.18* 3.34*  CALCIUM 7.4* 8.0*   Liver Function Tests: Recent Labs  Lab 07/05/18 1208  AST 11*  ALT 11  ALKPHOS 118  BILITOT 0.3  PROT 6.0*  ALBUMIN 3.1*   No results for input(s): LIPASE, AMYLASE in the last 168 hours. No results for input(s): AMMONIA in the last 168 hours. CBC: Recent Labs  Lab 07/05/18 1208 07/06/18 0329  WBC 9.6 10.9*  NEUTROABS 7.0  --   HGB 11.6* 10.9*  HCT 37.1 34.4*  MCV 106.0* 102.7*  PLT 191 211   Cardiac Enzymes: No results for input(s): CKTOTAL, CKMB, CKMBINDEX, TROPONINI in the last 168  hours. BNP: Invalid input(s): POCBNP CBG: Recent Labs  Lab 07/07/18 0820 07/07/18 1206  GLUCAP 94 144*      Disposition and Follow-up: Discharge Instructions    Diet Carb Modified   Complete by:  As directed    Discharge instructions   Complete by:  As directed    Please check your fasting blood sugars and meal times. Start Lantus 10 Units in the morning on 07/08/2018, if your blood sugar is above 140. Call your endocrinologist, Dr Posey Pronto on Tuesday 5/26 for further recommendations and follow up asap this week. On Tuesday morning, If blood sugars are high above 200, increase Lantus to 13units twice a day and follow his advice.     It is VERY IMPORTANT that you follow up with a PCP on a regular basis.  Check your blood glucoses before each meal and at bedtime and maintain a log of your readings.  Bring this log with you when you follow up with Dr Posey Pronto so that he can adjust your insulin at your follow up visit.   Increase activity slowly   Complete by:  As directed        DISPOSITION: Home   DISCHARGE FOLLOW-UP Follow-up Information    Schedule an appointment as soon as possible for a visit  with Glendon Axe, MD.   Specialty:  Family Medicine Why:  As needed, If symptoms worsen Contact information: [place] [address] [phone]        Amalia Greenhouse, MD. Schedule an appointment as soon as possible for a visit.   Specialty:  Endocrinology Why:  please call on tuesday and follow-up asap this week.  Contact information: [address] [place] [address] [phone]            Time coordinating discharge:  35 minutes  Signed:   Estill Cotta M.D. [facility] 07/07/2018, 1:08 PM

## 2018-07-07 NOTE — Progress Notes (Addendum)
[facility] Progress Note   Dialysis Orders:  [place] 3.5 hr Otpiflux 200 350/700 no heparin 2K 2.5 Ca bath 350/700 var NA 147 linear hectorol 9 weekly Fe , no ESA EDW 236# = 107.2 kg  Assessment/Plan: 1.  Syncope - presumed secondary to hypoglycemic episode - on bid Lantus 30; check orthostatics pre HD- per primary- BS corrected - BP nonorthostatic- 2.  ESRD -  TTS - [place] - last HD Tuesday just finished HD.No problems. 3. Hypertension/volume  - on coreg 25 bid and entresto- initially hypertensive - nonorthostatic neg UF 1.5 - post wt 106.9 kg inline with outpt EDW  CXR clear 4. Anemia  - hgb 10.9 - only on weekly Fe - no ESA at present 5. Metabolic bone disease -  Hypocalcemic - missed Hectorol 9 and dialysis Thursday likely contributory. Hold sensipar 30 for now due to low Ca; continue renvela 6. Nutrition - regular diet for now due to hypoglycemia -  7. Disp - hopefully for d/c today - ok per renal standpoint   Myriam Jacobson, PA-C [place] [phone] 07/07/2018,8:01 AM  LOS: 1 day   Pt seen, examined and agree w A/P as above.  Kelly Splinter  MD 07/07/2018, 1:00 PM    Subjective:   No c/o  Objective Vitals:   07/07/18 0500 07/07/18 0530 07/07/18 0600 07/07/18 0630  BP: (!) 124/56 137/67 137/63 (!) 135/57  Pulse: 83 87 85 83  Resp:      Temp:      TempSrc:      SpO2:      Weight:      Height:       Physical Exam General: NAD Heart: RRR Lungs: some soft wheezes Abdomen: obese soft NT Extremities: no LE edema  Dialysis Access: left AVF + bruit   Additional Objective Labs: Basic Metabolic Panel: Recent Labs  Lab 07/05/18 1208 07/06/18 0329  NA 140 138  K 4.5 5.1  CL 106 106  CO2 24 19*  GLUCOSE 119* 55*  BUN 39* 43*  CREATININE 4.91* 5.18*  CALCIUM 7.7* 7.4*   Liver Function Tests: Recent Labs  Lab 07/05/18 1208  AST 11*  ALT 11  ALKPHOS 118  BILITOT 0.3  PROT 6.0*  ALBUMIN 3.1*    No results for input(s): LIPASE, AMYLASE in the last 168 hours. CBC: Recent Labs  Lab 07/05/18 1208 07/06/18 0329  WBC 9.6 10.9*  NEUTROABS 7.0  --   HGB 11.6* 10.9*  HCT 37.1 34.4*  MCV 106.0* 102.7*  PLT 191 211   Blood Culture    Component Value Date/Time   SDES ABSCESS LEFT GROIN 05/22/2013 1546   SPECREQUEST NONE 05/22/2013 1546   CULT  05/22/2013 1546    MODERATE METHICILLIN RESISTANT STAPHYLOCOCCUS AUREUS Note: RIFAMPIN AND GENTAMICIN SHOULD NOT BE USED AS SINGLE DRUGS FOR TREATMENT OF STAPH INFECTIONS. Performed at [facility] 05/25/2013 FINAL 05/22/2013 1546    Cardiac Enzymes: No results for input(s): CKTOTAL, CKMB, CKMBINDEX, TROPONINI in the last 168 hours. CBG: Recent Labs  Lab 07/06/18 1136 07/06/18 1511 07/06/18 1723 07/06/18 2018 07/07/18 0009  GLUCAP 43* 167* 130* 183* 103*   Iron Studies: No results for input(s): IRON, TIBC, TRANSFERRIN, FERRITIN in the last 72 hours. No results found for: INR, PROTIME Studies/Results: Dg Chest 2 View  Result Date: 07/05/2018 CLINICAL DATA:  Syncope, status post fall EXAM: CHEST - 2 VIEW COMPARISON:  05/03/2018 FINDINGS: There is no focal parenchymal opacity.  There is no pleural effusion or pneumothorax. The heart and mediastinal contours are unremarkable. The osseous structures are unremarkable. IMPRESSION: No active cardiopulmonary disease. Electronically Signed   By: Kathreen Devoid   On: 07/05/2018 12:44   Ct Head Wo Contrast  Result Date: 07/05/2018 CLINICAL DATA:  Syncopal episode.  Fall.  Trauma to head. EXAM: CT HEAD WITHOUT CONTRAST TECHNIQUE: Contiguous axial images were obtained from the base of the skull through the vertex without intravenous contrast. COMPARISON:  CT head without contrast 02/21/2016 FINDINGS: Brain: No acute infarct, hemorrhage, or mass lesion is present. 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 No significant white matter lesions are present. Basal ganglia are  intact. No significant extraaxial fluid collection is present. The brainstem and cerebellum are within normal limits. Vascular: Atherosclerotic calcifications are present within the cavernous internal carotid arteries. There is no hyperdense vessel. Skull: A large right supraorbital scalp hematoma and laceration is present. No radiopaque foreign body is present. There is no underlying fracture. Sinuses/Orbits: The paranasal sinuses and mastoid air cells are clear. The globes and orbits are within normal limits. IMPRESSION: 1. Large right supraorbital scalp hematoma and laceration without underlying fracture. 2. No acute intracranial abnormality. 3. 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 Electronically Signed   By: San Morelle M.D.   On: 07/05/2018 13:45   Medications: . ferric gluconate (FERRLECIT/NULECIT) IV 62.5 mg (07/07/18 0607)   . aspirin EC  81 mg Oral Daily  . carvedilol  25 mg Oral BID WC  . Chlorhexidine Gluconate Cloth  6 each Topical Q0600  . [START ON 07/08/2018] doxercalciferol  9 mcg Intravenous Q M,W,F-HD  . multivitamin  1 tablet Oral QHS  . nicotine  14 mg Transdermal Daily  . pantoprazole  40 mg Oral Daily  . pregabalin  150 mg Oral QPM  . rosuvastatin  40 mg Oral Daily  . sacubitril-valsartan  1 tablet Oral Daily  . sevelamer carbonate  800 mg Oral TID WC  . sodium chloride flush  3 mL Intravenous Q12H

## 2018-07-14 LAB — PROINSULIN/INSULIN RATIO
Insulin: 91 u[IU]/mL — ABNORMAL HIGH
Proinsulin/Insulin Ratio: 3 %
Proinsulin: 21 pmol/L

## 2018-07-16 ENCOUNTER — Telehealth: Payer: Self-pay

## 2018-07-16 DIAGNOSIS — Z20822 Contact with and (suspected) exposure to covid-19: Secondary | ICD-10-CM

## 2018-07-16 NOTE — Telephone Encounter (Signed)
Phone call to pt.  Advised that during recent hospital stay, she may have been exposed to COVID 19.  Offered appt. to test for COVID 19 .  Appt. accepted for 07/17/18 @ 1:15 PM, at the [facility] site.  Verb. Understanding of instructions given.

## 2018-07-17 ENCOUNTER — Telehealth: Payer: Self-pay

## 2018-07-17 ENCOUNTER — Other Ambulatory Visit: Payer: Self-pay

## 2018-07-17 DIAGNOSIS — Z20822 Contact with and (suspected) exposure to covid-19: Secondary | ICD-10-CM

## 2018-07-17 NOTE — Telephone Encounter (Signed)
Patient was scheduled for COVID screening at [facility] on 07/17/18.  She missed her appointment and she was contacted.  She stated she relied on public transportation and was unable to get to the appointment.  She was offered the opportunity to reschedule and she said she would discuss with her local MD if she could have test performed closer to her home in [facility].

## 2020-04-04 IMAGING — CT CT HEAD WITHOUT CONTRAST
4 series · 16 of 47 positions shown, 18 images · non-contrast
Comparison: CT head without contrast 02/21/2016

CLINICAL DATA: Syncopal episode.  Fall.  Trauma to head.

EXAM:
CT HEAD WITHOUT CONTRAST
TECHNIQUE: Contiguous axial images were obtained from the base of the skull
through the vertex without intravenous contrast.

[Series 2: head wo · axial · 0.43mm/px · z∈[+1243,+1373]mm · 7 of 36 slices shown, 9 images]
[im 5/36  brain]
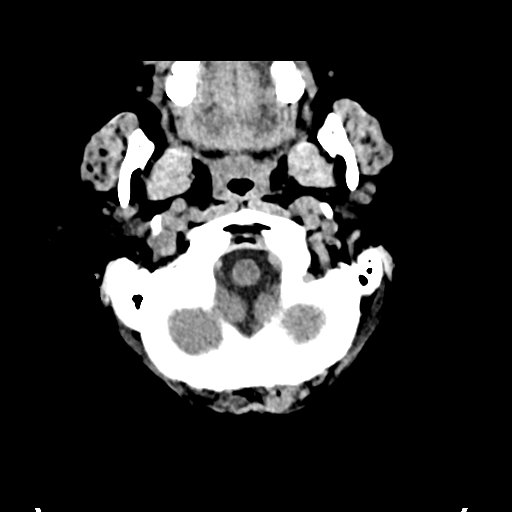
[im 5/36  bone]
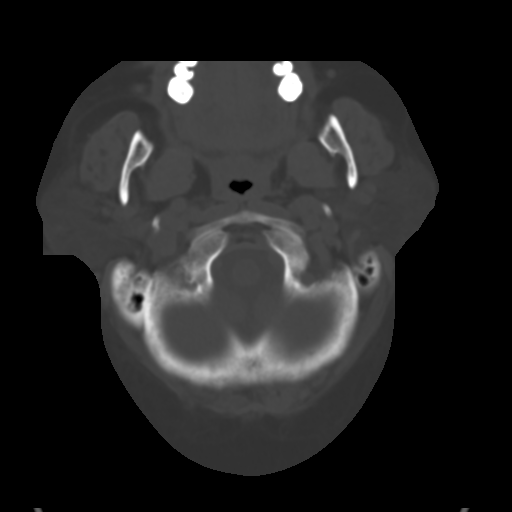
[im 9/36  brain]
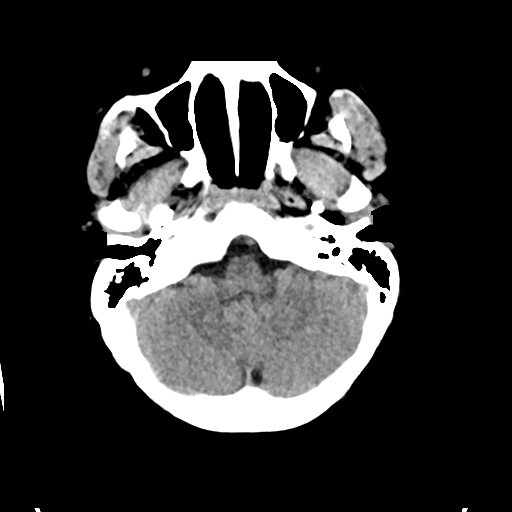
[im 14/36  brain]
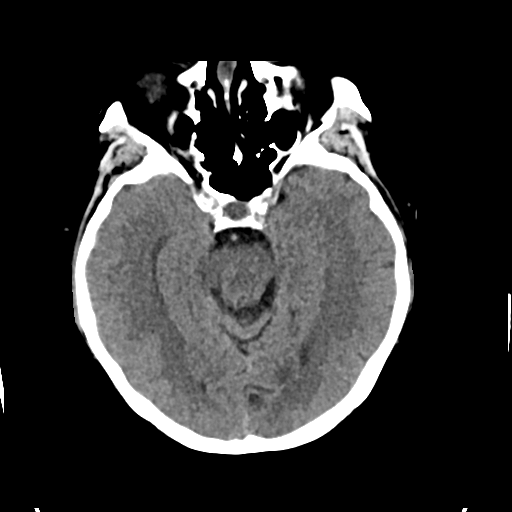
[im 18/36  brain]
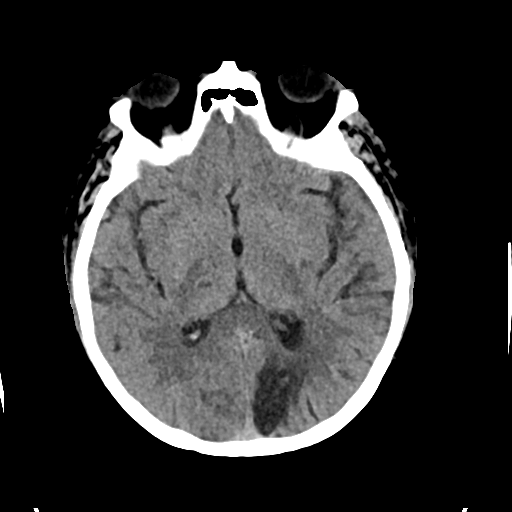
[im 22/36  brain]
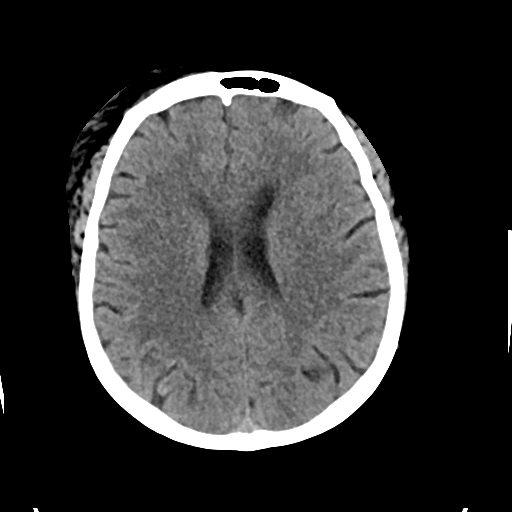
[im 22/36  bone]
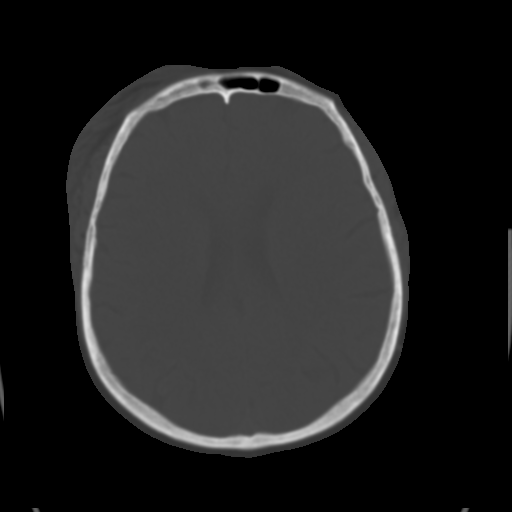
[im 27/36  brain]
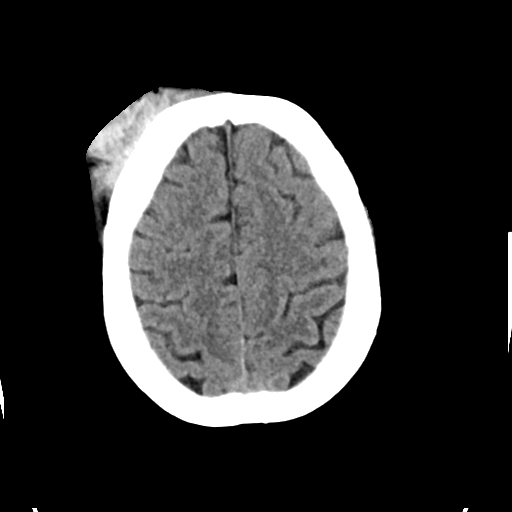
[im 31/36  brain]
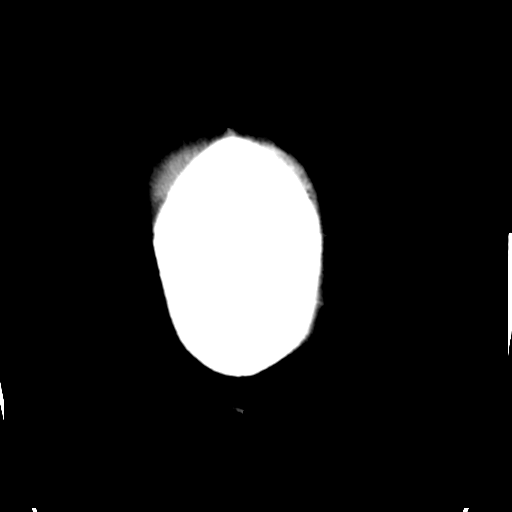

[Series 3: head bone · axial · 0.43mm/px · z∈[+1239,+1275]mm · 3 of 90 slices shown]
[im 9/90  bone]
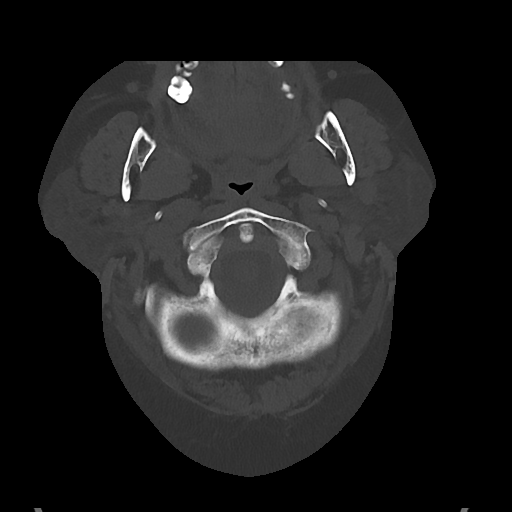
[im 18/90  bone]
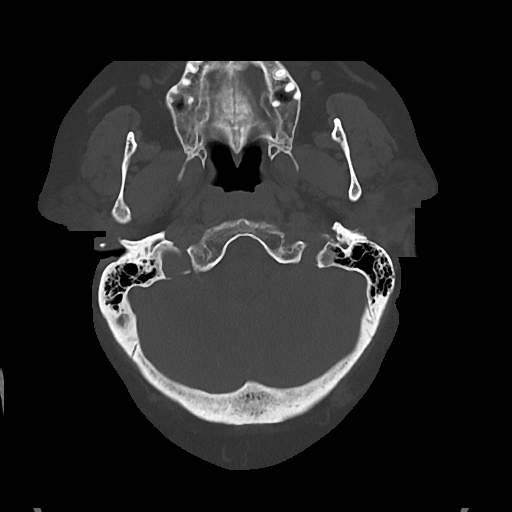
[im 27/90  bone]
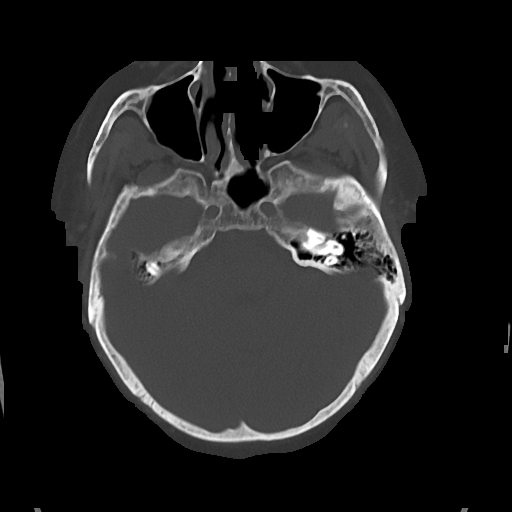

[Series 4: cor soft · coronal · 0.35mm/px · 3 of 74 slices shown]
[im 25/74  brain]
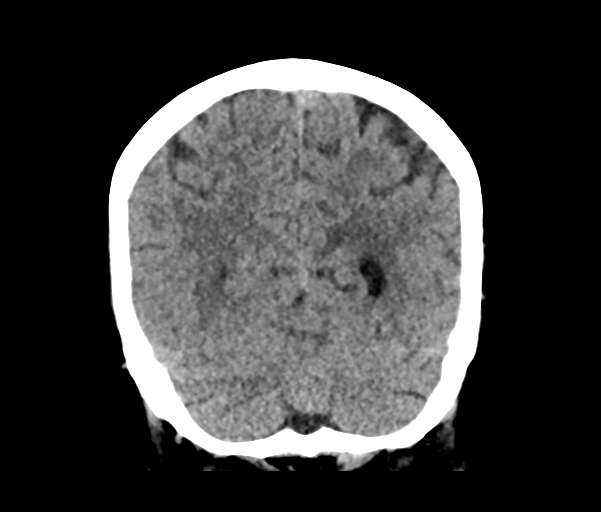
[im 33/74  brain]
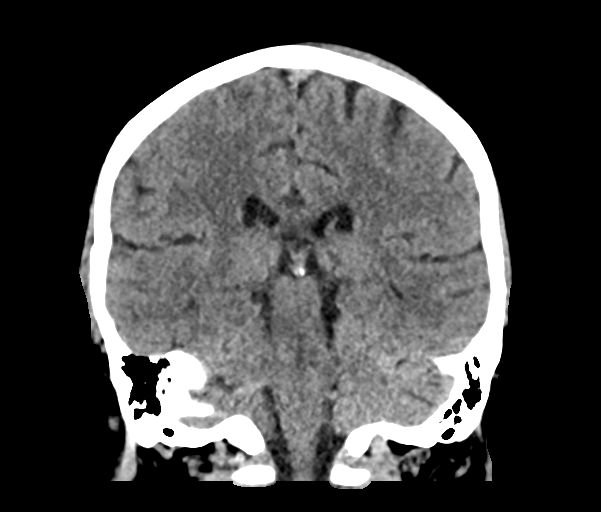
[im 41/74  brain]
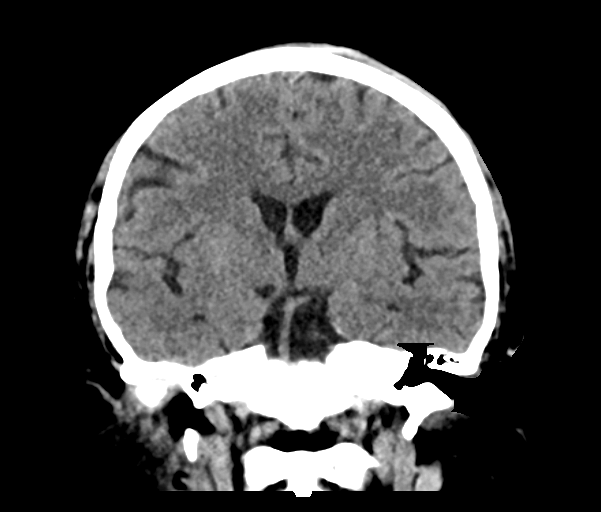

[Series 5: sag soft · sagittal · 0.35mm/px · 3 of 66 slices shown]
[im 22/66  brain]
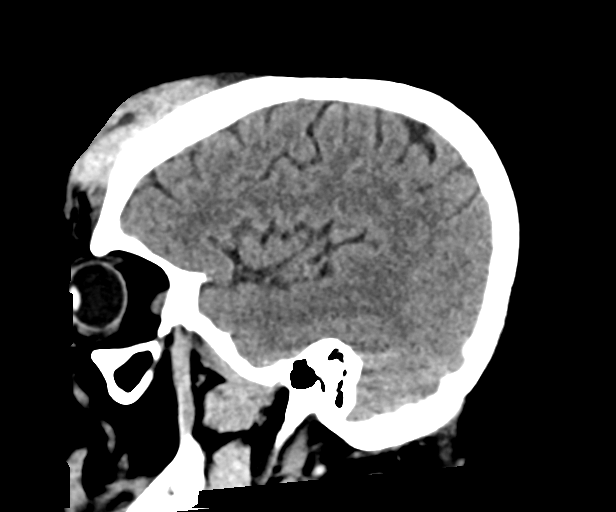
[im 33/66  brain]
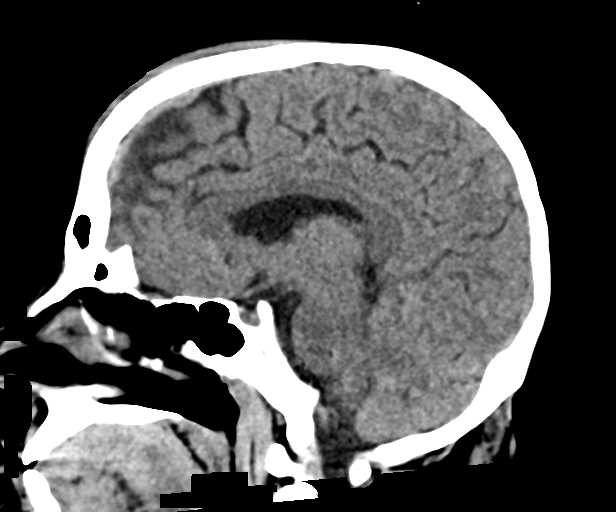
[im 44/66  brain]
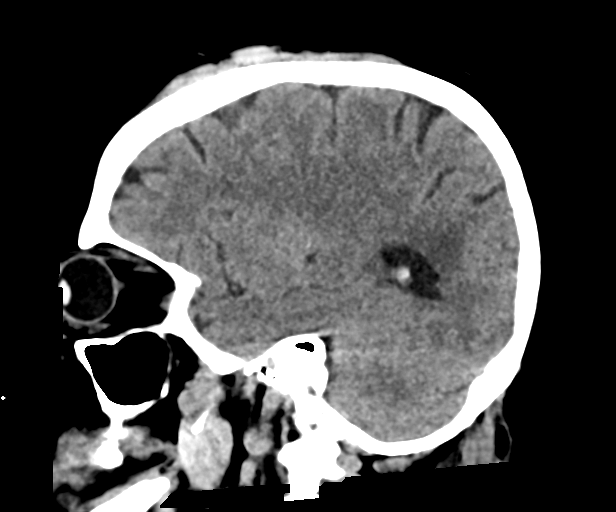

[16 of 47 positions shown; findings below may reference images not displayed]

FINDINGS: Brain: No acute infarct, hemorrhage, or mass lesion is present. A
remote left parietal infarct is again seen. No significant white
matter lesions are present. Basal ganglia are intact. No significant
extraaxial fluid collection is present. The brainstem and cerebellum
are within normal limits.

Vascular: Atherosclerotic calcifications are present within the
cavernous internal carotid arteries. There is no hyperdense vessel.

Skull: A large right supraorbital scalp hematoma and laceration is
present. No radiopaque foreign body is present. There is no
underlying fracture.

Sinuses/Orbits: The paranasal sinuses and mastoid air cells are
clear. The globes and orbits are within normal limits.
IMPRESSION: 1. Large right supraorbital scalp hematoma and laceration without
underlying fracture.
2. No acute intracranial abnormality.
3. Stable remote encephalomalacia of the left occipital and parietal
lobes.

## 2022-06-26 ENCOUNTER — Emergency Department (HOSPITAL_BASED_OUTPATIENT_CLINIC_OR_DEPARTMENT_OTHER): Payer: Medicaid Other

## 2022-06-26 ENCOUNTER — Encounter (HOSPITAL_BASED_OUTPATIENT_CLINIC_OR_DEPARTMENT_OTHER): Payer: Self-pay

## 2022-06-26 ENCOUNTER — Other Ambulatory Visit: Payer: Self-pay

## 2022-06-26 DIAGNOSIS — W109XXA Fall (on) (from) unspecified stairs and steps, initial encounter: Secondary | ICD-10-CM | POA: Insufficient documentation

## 2022-06-26 DIAGNOSIS — I10 Essential (primary) hypertension: Secondary | ICD-10-CM | POA: Diagnosis not present

## 2022-06-26 DIAGNOSIS — S62101A Fracture of unspecified carpal bone, right wrist, initial encounter for closed fracture: Secondary | ICD-10-CM | POA: Insufficient documentation

## 2022-06-26 DIAGNOSIS — Z7982 Long term (current) use of aspirin: Secondary | ICD-10-CM | POA: Insufficient documentation

## 2022-06-26 DIAGNOSIS — E119 Type 2 diabetes mellitus without complications: Secondary | ICD-10-CM | POA: Diagnosis not present

## 2022-06-26 DIAGNOSIS — J449 Chronic obstructive pulmonary disease, unspecified: Secondary | ICD-10-CM | POA: Insufficient documentation

## 2022-06-26 DIAGNOSIS — Z794 Long term (current) use of insulin: Secondary | ICD-10-CM | POA: Insufficient documentation

## 2022-06-26 DIAGNOSIS — M25531 Pain in right wrist: Secondary | ICD-10-CM | POA: Diagnosis present

## 2022-06-26 DIAGNOSIS — Z79899 Other long term (current) drug therapy: Secondary | ICD-10-CM | POA: Diagnosis not present

## 2022-06-26 NOTE — ED Triage Notes (Signed)
Pt tripped down 6 stairs at home onto cement - pt c/o right wrist pain and swelling.

## 2022-06-27 ENCOUNTER — Emergency Department (HOSPITAL_BASED_OUTPATIENT_CLINIC_OR_DEPARTMENT_OTHER)
Admission: EM | Admit: 2022-06-27 | Discharge: 2022-06-27 | Disposition: A | Discharge status: Home / Self Care | Payer: Medicaid Other | Attending: Emergency Medicine | Admitting: Emergency Medicine

## 2022-06-27 DIAGNOSIS — W109XXA Fall (on) (from) unspecified stairs and steps, initial encounter: Secondary | ICD-10-CM

## 2022-06-27 DIAGNOSIS — S62101A Fracture of unspecified carpal bone, right wrist, initial encounter for closed fracture: Secondary | ICD-10-CM

## 2022-06-27 MED ORDER — OXYCODONE-ACETAMINOPHEN 5-325 MG PO TABS
1.0000 | ORAL_TABLET | Freq: Once | ORAL | Status: AC
  Administered 2022-06-27: 1 via ORAL
  Filled 2022-06-27: qty 1

## 2022-06-27 NOTE — ED Provider Notes (Signed)
[facility] Provider Note: Lowella Dell, MD, FACEP  CSN: [id number] MRN: [id number] ARRIVAL: 06/26/22 at 2240 ROOM: [facility]08/[facility]08   CHIEF COMPLAINT  Fall   HISTORY OF PRESENT ILLNESS  06/27/22 1:10 AM Audrey Olson is a 62 y.o. female who fell down 6 stairs at home landing onto a cement floor.  She injured her right wrist in the process and has pain and swelling in that wrist.  She rates her pain as a 10 out of 10, worse with movement or palpation.  She denies other injury except for generalized soreness.  She had a fracture in her left wrist about 3 years ago which was never surgically corrected.  She has chronic pain in that wrist and wears a wrist splint.    Past Medical History:  Diagnosis Date   Alpha-1-antitrypsin deficiency (HCC)    pt is not aware of this diagnosis   Anemia    Anxiety    Arthritis    Lower back and both hips with severe chronic pain and limited mobility   Bell's palsy    Chronic combined systolic and diastolic CHF (congestive heart failure) (HCC)    a. 04/2013 Echo: EF 25-30%.   Chronic pain    fibromyalgia, LBP   COPD (chronic obstructive pulmonary disease) (HCC)    uses Albuterol daily as needed   Degeneration of lumbar or lumbosacral intervertebral disc    Diabetic neuropathy (HCC)    Generalized anxiety disorder    Hepatomegaly    With splenomegaly, hemangioma of spleen   History of colon polyps    History of gastric ulcer    History of MRSA infection 2005   Reported in 2005. Also had I&D of groin abscess 05/2013 that grew MRSA.   Hyperlipidemia    takes Crestor daily as well as Lovaza   Hypertension    takes Diovan daily   Internal hemorrhoids    LVH (left ventricular hypertrophy)    Obstructive sleep apnea    a. mild OSA by 08/16/11 sleep study [facility]);  b. does not tolerate CPAP.   Osteoporosis    Peripheral neuropathy    takes Lyrica daily   Restless legs syndrome (RLS)    Severe obesity (BMI 35.0-39.9) with  comorbidity (HCC)    Tobacco abuse    Type II diabetes mellitus (HCC)    Insulin-dependent since 2008 - takes Novolog and Lantus daily as well as Metformin   Vitamin D deficiency    takes Vit D 50000 units weekly    Past Surgical History:  Procedure Laterality Date   APPENDECTOMY     BREAST BIOPSY Left    CARDIAC CATHETERIZATION  around 2007/2014   x 2   CHOLECYSTECTOMY     CHOLECYSTECTOMY     COLONOSCOPY     ESOPHAGOGASTRODUODENOSCOPY     ganglion cyst removed Left    MULTIPLE EXTRACTIONS WITH ALVEOLOPLASTY N/A 05/21/2013   Procedure: Extraction of tooth #'s 12,19, and 30 with alveoloplasty and gross debridement of remaining teeth;  Surgeon: Charlynne Pander, DDS;  Location: [facility] OR;  Service: Oral Surgery;  Laterality: N/A;   MULTIPLE TOOTH EXTRACTIONS  05/21/2013   TOOTH #12  #19  WITH ALVEOLOPLASTY     NASAL SINUS SURGERY      Family History  Problem Relation Age of Onset   Hyperlipidemia Mother    Hypertension Mother    Lung disease Mother 12   Hyperlipidemia Father    Diabetes Mellitus II Father    Hypertension  Father    Cancer Father     Social History   Tobacco Use   Smoking status: Every Day    Packs/day: 2.00    Years: 41.00    Additional pack years: 0.00    Total pack years: 82.00    Types: Cigarettes    Start date: 02/13/1974   Smokeless tobacco: Never   Tobacco comments:    06/2018: currently smoking 1/3 ppd.  Substance Use Topics   Alcohol use: No    Comment: rare   Drug use: No    Prior to Admission medications   Medication Sig Start Date End Date Taking? Authorizing Provider  albuterol (PROAIR HFA) 108 (90 Base) MCG/ACT inhaler Inhale 2 puffs into the lungs 3 (three) times daily as needed for wheezing or shortness of breath.    [provider]  aspirin EC 81 MG tablet Take 81 mg by mouth daily.    [provider]  aspirin-sod bicarb-citric acid (ALKA-SELTZER) 325 MG TBEF tablet Take 325 mg by mouth every 6 (six) hours as needed  (for indigestion).    [provider]  calcium carbonate (TUMS EX) 750 MG chewable tablet Chew 1 tablet by mouth daily.    [provider]  carvedilol (COREG) 25 MG tablet Take 25 mg by mouth 2 (two) times daily with a meal.    [provider]  cinacalcet (SENSIPAR) 30 MG tablet Take 30 mg by mouth every Monday, Wednesday, and Friday.    [provider]  clonazePAM (KLONOPIN) 1 MG tablet Take 1 mg by mouth at bedtime.     [provider]  cyclobenzaprine (FLEXERIL) 5 MG tablet Take 5 mg by mouth 3 (three) times daily as needed for muscle spasms.    [provider]  fexofenadine (ALLEGRA) 180 MG tablet Take 180 mg by mouth daily.    [provider]  fluticasone (FLONASE) 50 MCG/ACT nasal spray Place 1-2 sprays into both nostrils daily as needed for allergies or rhinitis.     [provider]  HYDROcodone-acetaminophen (NORCO) 10-325 MG per tablet Take 1 tablet by mouth 3 (three) times daily.     [provider]  insulin aspart (NOVOLOG FLEXPEN) 100 UNIT/ML FlexPen Inject 3-8 Units into the skin See admin instructions. Inject 3-8 units into the skin three times a day before meals, per sliding scale    [provider]  insulin glargine (LANTUS) 100 UNIT/ML injection Inject 0.1 mLs (10 Units total) into the skin 2 (two) times daily. Morning and bedtime. Please see discharge instructions. 07/08/18   Rai, Ripudeep K, MD  lidocaine (LIDODERM) 5 % Place 1 patch onto the skin daily as needed (for back pain). Remove & Discard patch within 12 hours or as directed by MD    [provider]  magnesium oxide (MAG-OX) 400 MG tablet Take 400 mg by mouth daily.    [provider]  naproxen sodium (ALEVE) 220 MG tablet Take 440 mg by mouth 2 (two) times daily as needed (for pain).    [provider]  nitroGLYCERIN (NITROSTAT) 0.4 MG SL tablet Place 0.4 mg under the tongue every 5 (five) minutes as needed for  chest pain.    [provider]  Omega-3 Fatty Acids (FISH OIL) 1000 MG CAPS Take 2,000 mg by mouth 2 (two) times daily with a meal.    [provider]  omeprazole (PRILOSEC) 40 MG capsule Take 40 mg by mouth daily before breakfast.     [provider]  PARoxetine (PAXIL) 20 MG tablet Take 20 mg by mouth daily as needed ("for panic attacks").     [provider]  pregabalin (LYRICA) 150 MG capsule Take 150 mg by mouth daily as needed (for neuropathy).     [provider]  rosuvastatin (CRESTOR) 40 MG tablet Take 40 mg by mouth at bedtime.     [provider]  sacubitril-valsartan (ENTRESTO) 24-26 MG Take 1 tablet by mouth daily.    [provider]  sevelamer (RENAGEL) 800 MG tablet Take 800 mg by mouth 3 (three) times daily before meals.    [provider]  Vitamin D, Ergocalciferol, (DRISDOL) 50000 UNITS CAPS capsule Take 50,000 Units by mouth every Friday.     [provider]    Allergies Ibuprofen, Sulfa antibiotics, Tape, Ropinirole, and Saccharomyces cerevisiae   REVIEW OF SYSTEMS  Negative except as noted here or in the History of Present Illness.   PHYSICAL EXAMINATION  Initial Vital Signs Blood pressure (!) 162/70, pulse 78, temperature 98 F (36.7 C), resp. rate 18, height 5\' 3"  (1.6 m), weight 89.8 kg, SpO2 96 %.  Examination General: Well-developed, well-nourished female in no acute distress; appearance consistent with age of record HENT: normocephalic; atraumatic Eyes: Normal appearance Neck: supple Heart: regular rate and rhythm Lungs: clear to auscultation bilaterally Abdomen: soft; nondistended; nontender; bowel sounds present Extremities: Tenderness and mild swelling of right wrist, right hand distally neurovascularly intact with intact tendon function Neurologic: Awake, alert and oriented; motor function intact in all extremities and symmetric; no facial droop Skin: Warm and  dry Psychiatric: Normal mood and affect   RESULTS  Summary of this visit's results, reviewed and interpreted by myself:   EKG Interpretation  Date/Time:    Ventricular Rate:    PR Interval:    QRS Duration:   QT Interval:    QTC Calculation:   R Axis:     Text Interpretation:         Laboratory Studies: No results found for this or any previous visit (from the past 24 hour(s)). Imaging Studies: DG Wrist Complete Right  Result Date: 06/26/2022 CLINICAL DATA:  Trauma wrist pain EXAM: RIGHT WRIST - COMPLETE 3+ VIEW COMPARISON:  None Available. FINDINGS: Extensive vascular calcification. Acute nondisplaced intra-articular fracture involving the distal radius. Old fracture deformity distal shaft of the ulna. Possible tiny fracture at the ulnar styloid. IMPRESSION: 1. Acute nondisplaced intra-articular distal radius fracture. Possible tiny ulnar styloid fracture. 2. Old fracture deformity of the distal ulna. Electronically Signed   By: Jasmine Pang M.D.   On: 06/26/2022 23:24    ED COURSE and MDM  Nursing notes, initial and subsequent vitals signs, including pulse oximetry, reviewed and interpreted by myself.  Vitals:   06/26/22 2257 06/26/22 2300  BP:  (!) 162/70  Pulse:  78  Resp:  18  Temp:  98 F (36.7 C)  SpO2:  96%  Weight: 89.8 kg   Height: 5\' 3"  (1.6 m)    Medications  oxyCODONE-acetaminophen (PERCOCET/ROXICET) 5-325 MG per tablet 1 tablet (has no administration in time range)   We will place the patient in a right thumb spica splint to keep her wrist and thumb immobilized.  She will follow-up with her orthopedic surgeon in [facility].  She is already on pain management receiving 120 Percocet tablets monthly.   PROCEDURES  Procedures   ED DIAGNOSES     ICD-10-CM   1. Fall on stairs, initial encounter  W10.9XXA  2. Closed fracture of right wrist, initial encounter  S62.101A          Sayan Aldava, MD 06/27/22 [phone]

## 2022-06-27 NOTE — Discharge Instructions (Signed)
Please make an appointment with your orthopedic surgeon for reevaluation of your wrist.
